# Patient Record
Sex: Male | Born: 1980 | Race: White | Hispanic: No | Marital: Married | Smoking: Former smoker
Health system: Southern US, Community
[De-identification: ages and names within clinical notes are randomized; demographics above are authoritative.]

## PROBLEM LIST (undated history)

## (undated) DIAGNOSIS — F419 Anxiety disorder, unspecified: Secondary | ICD-10-CM

## (undated) HISTORY — PX: ANTERIOR CRUCIATE LIGAMENT REPAIR: SHX115

---
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
## 2004-02-05 ENCOUNTER — Emergency Department (HOSPITAL_COMMUNITY): Admission: EM | Admit: 2004-02-05 | Discharge: 2004-02-05 | Payer: Self-pay | Admitting: Podiatry

## 2011-05-24 ENCOUNTER — Ambulatory Visit (INDEPENDENT_AMBULATORY_CARE_PROVIDER_SITE_OTHER): Payer: Self-pay

## 2011-05-24 DIAGNOSIS — H66009 Acute suppurative otitis media without spontaneous rupture of ear drum, unspecified ear: Secondary | ICD-10-CM

## 2011-05-24 DIAGNOSIS — J029 Acute pharyngitis, unspecified: Secondary | ICD-10-CM

## 2013-07-18 ENCOUNTER — Ambulatory Visit (INDEPENDENT_AMBULATORY_CARE_PROVIDER_SITE_OTHER): Payer: BC Managed Care – PPO | Admitting: Family Medicine

## 2013-07-18 VITALS — BP 122/80 | HR 62 | Temp 97.9°F | Resp 18 | Ht 71.5 in | Wt 231.5 lb

## 2013-07-18 DIAGNOSIS — G8929 Other chronic pain: Secondary | ICD-10-CM

## 2013-07-18 DIAGNOSIS — R1013 Epigastric pain: Secondary | ICD-10-CM

## 2013-07-18 LAB — POCT CBC
Granulocyte percent: 63.9 %G (ref 37–80)
HCT, POC: 50.4 % (ref 43.5–53.7)
Hemoglobin: 16.4 g/dL (ref 14.1–18.1)
Lymph, poc: 3.2 (ref 0.6–3.4)
MCH, POC: 31.1 pg (ref 27–31.2)
MCHC: 32.5 g/dL (ref 31.8–35.4)
MCV: 95.4 fL (ref 80–97)
MID (cbc): 1.4 — AB (ref 0–0.9)
MPV: 8.6 fL (ref 0–99.8)
POC Granulocyte: 8.2 — AB (ref 2–6.9)
POC LYMPH PERCENT: 24.9 %L (ref 10–50)
POC MID %: 11.2 %M (ref 0–12)
Platelet Count, POC: 307 10*3/uL (ref 142–424)
RBC: 5.28 M/uL (ref 4.69–6.13)
RDW, POC: 13.6 %
WBC: 12.8 10*3/uL — AB (ref 4.6–10.2)

## 2013-07-18 NOTE — Addendum Note (Signed)
Addended by: Gerrianne ScalePAYNE, Jasmin Trumbull L on: 07/18/2013 04:41 PM   Modules accepted: Orders

## 2013-07-18 NOTE — Progress Notes (Signed)
   Subjective:    Patient ID: Matthew Vazquez, male    DOB: 08/20/1980, 33 y.o.   MRN: [id number]  HPI Patient comes into our office with abdominal pain that's been going on for a little over a month He has been seeing black stool for about a month sometimes he may see red blood He's an social beer drinker drinks about a 12 pack in a week Been using Pepto bismol for a month to calm down his diarrhea No family HX of colon cancer or stomach cancer No HX of GI problems  Works in a factory  Review of Systems  Constitutional: Positive for appetite change and fatigue. Negative for fever.  Gastrointestinal: Positive for nausea, vomiting, abdominal pain and blood in stool.       Vomiting once or twice had some blood in it when it happen the first time  Genitourinary: Positive for dysuria.       A few times it burn when he urinate  Musculoskeletal: Positive for back pain.       Lower back pain   Frequent belching    Objective:   Physical Exam NAD No jaundice Chest:  Clear Heart:  Reg, no murmur Abdomen:  Active BS, nontender, no HSM or mass      Assessment & Plan:  Epigastric pains  .Abdominal pain, chronic, epigastric - Plan: POCT CBC, H. pylori Screen prilosec 20 mg OTC qd until labs back. Signed, Elvina SidleKurt Lisvet Rasheed, MD

## 2013-07-20 ENCOUNTER — Telehealth: Payer: Self-pay

## 2013-07-20 LAB — H. PYLORI ANTIBODY, IGG: H Pylori IgG: 0.53 {ISR}

## 2013-07-20 NOTE — Telephone Encounter (Addendum)
Wants a call back regarding the next step for abdominal pain.  Still in pain.  [phone]  Wife calling back for next step in treatment plan.  She is very concerned about her husbands condition.

## 2013-07-30 ENCOUNTER — Emergency Department (HOSPITAL_COMMUNITY): Payer: BC Managed Care – PPO

## 2013-07-30 ENCOUNTER — Encounter (HOSPITAL_COMMUNITY)
Admission: EM | Disposition: A | Discharge status: Home / Self Care | Payer: Self-pay | Source: Home / Self Care | Attending: Emergency Medicine

## 2013-07-30 ENCOUNTER — Encounter (HOSPITAL_COMMUNITY): Payer: BC Managed Care – PPO | Admitting: Anesthesiology

## 2013-07-30 ENCOUNTER — Observation Stay (HOSPITAL_COMMUNITY): Payer: BC Managed Care – PPO | Admitting: Anesthesiology

## 2013-07-30 ENCOUNTER — Observation Stay (HOSPITAL_COMMUNITY)
Admission: EM | Admit: 2013-07-30 | Discharge: 2013-07-31 | Disposition: A | Discharge status: Home / Self Care | Payer: BC Managed Care – PPO | Attending: Surgery | Admitting: Surgery

## 2013-07-30 ENCOUNTER — Encounter (HOSPITAL_COMMUNITY): Payer: Self-pay | Admitting: Emergency Medicine

## 2013-07-30 DIAGNOSIS — R300 Dysuria: Secondary | ICD-10-CM | POA: Insufficient documentation

## 2013-07-30 DIAGNOSIS — R110 Nausea: Secondary | ICD-10-CM | POA: Insufficient documentation

## 2013-07-30 DIAGNOSIS — K219 Gastro-esophageal reflux disease without esophagitis: Secondary | ICD-10-CM | POA: Insufficient documentation

## 2013-07-30 DIAGNOSIS — K5900 Constipation, unspecified: Secondary | ICD-10-CM | POA: Insufficient documentation

## 2013-07-30 DIAGNOSIS — K3580 Unspecified acute appendicitis: Principal | ICD-10-CM | POA: Diagnosis present

## 2013-07-30 DIAGNOSIS — Z79899 Other long term (current) drug therapy: Secondary | ICD-10-CM | POA: Insufficient documentation

## 2013-07-30 DIAGNOSIS — R079 Chest pain, unspecified: Secondary | ICD-10-CM | POA: Insufficient documentation

## 2013-07-30 DIAGNOSIS — Z87891 Personal history of nicotine dependence: Secondary | ICD-10-CM | POA: Insufficient documentation

## 2013-07-30 DIAGNOSIS — K37 Unspecified appendicitis: Secondary | ICD-10-CM | POA: Diagnosis present

## 2013-07-30 DIAGNOSIS — R1311 Dysphagia, oral phase: Secondary | ICD-10-CM | POA: Insufficient documentation

## 2013-07-30 HISTORY — PX: LAPAROSCOPIC APPENDECTOMY: SHX408

## 2013-07-30 LAB — COMPREHENSIVE METABOLIC PANEL
ALBUMIN: 4.2 g/dL (ref 3.5–5.2)
ALK PHOS: 42 U/L (ref 39–117)
ALT: 18 U/L (ref 0–53)
AST: 24 U/L (ref 0–37)
BUN: 12 mg/dL (ref 6–23)
CHLORIDE: 100 meq/L (ref 96–112)
CO2: 29 meq/L (ref 19–32)
Calcium: 9.5 mg/dL (ref 8.4–10.5)
Creatinine, Ser: 0.93 mg/dL (ref 0.50–1.35)
GFR calc Af Amer: >90 mL/min (ref >90)
GFR calc non Af Amer: >90 mL/min (ref >90)
Glucose, Bld: 95 mg/dL (ref 70–99)
Potassium: 4.4 mEq/L (ref 3.7–5.3)
Sodium: 139 mEq/L (ref 137–147)
Total Bilirubin: 1 mg/dL (ref 0.3–1.2)
Total Protein: 7.3 g/dL (ref 6.0–8.3)

## 2013-07-30 LAB — CBC WITH DIFFERENTIAL/PLATELET
BASOS ABS: 0.1 10*3/uL (ref 0.0–0.1)
BASOS PCT: 1 % (ref 0–1)
Eosinophils Absolute: 1.5 10*3/uL — ABNORMAL HIGH (ref 0.0–0.7)
Eosinophils Relative: 12 % — ABNORMAL HIGH (ref 0–5)
HCT: 46.2 % (ref 39.0–52.0)
Hemoglobin: 16.3 g/dL (ref 13.0–17.0)
LYMPHS PCT: 20 % (ref 12–46)
Lymphs Abs: 2.4 10*3/uL (ref 0.7–4.0)
MCH: 31.1 pg (ref 26.0–34.0)
MCHC: 35.3 g/dL (ref 30.0–36.0)
MCV: 88.2 fL (ref 78.0–100.0)
Monocytes Absolute: 0.9 10*3/uL (ref 0.1–1.0)
Monocytes Relative: 8 % (ref 3–12)
NEUTROS PCT: 59 % (ref 43–77)
Neutro Abs: 7.1 10*3/uL (ref 1.7–7.7)
Platelets: 251 10*3/uL (ref 150–400)
RBC: 5.24 MIL/uL (ref 4.22–5.81)
RDW: 12.9 % (ref 11.5–15.5)
WBC: 12 10*3/uL — AB (ref 4.0–10.5)

## 2013-07-30 LAB — URINALYSIS, ROUTINE W REFLEX MICROSCOPIC
Bilirubin Urine: NEGATIVE
GLUCOSE, UA: NEGATIVE mg/dL
HGB URINE DIPSTICK: NEGATIVE
KETONES UR: NEGATIVE mg/dL
Leukocytes, UA: NEGATIVE
Nitrite: NEGATIVE
PH: 7 (ref 5.0–8.0)
Protein, ur: NEGATIVE mg/dL
Specific Gravity, Urine: 1.02 (ref 1.005–1.030)
Urobilinogen, UA: 1 mg/dL (ref 0.0–1.0)

## 2013-07-30 LAB — LIPASE, BLOOD: Lipase: 35 U/L (ref 11–59)

## 2013-07-30 SURGERY — APPENDECTOMY, LAPAROSCOPIC
Anesthesia: General

## 2013-07-30 MED ORDER — FENTANYL CITRATE 0.05 MG/ML IJ SOLN
INTRAMUSCULAR | Status: AC
  Filled 2013-07-30: qty 5

## 2013-07-30 MED ORDER — ONDANSETRON HCL 4 MG/2ML IJ SOLN
INTRAMUSCULAR | Status: DC | PRN
  Administered 2013-07-30: 4 mg via INTRAVENOUS

## 2013-07-30 MED ORDER — ONDANSETRON HCL 4 MG/2ML IJ SOLN: 4.0000 mg | Freq: Four times a day (QID) | INTRAMUSCULAR | Status: DC | PRN

## 2013-07-30 MED ORDER — SODIUM CHLORIDE 0.9 % IV SOLN
INTRAVENOUS | Status: DC
  Administered 2013-07-30: 17:00:00 via INTRAVENOUS

## 2013-07-30 MED ORDER — LIDOCAINE HCL (CARDIAC) 20 MG/ML IV SOLN
INTRAVENOUS | Status: AC
  Filled 2013-07-30: qty 5

## 2013-07-30 MED ORDER — ROCURONIUM BROMIDE 100 MG/10ML IV SOLN
INTRAVENOUS | Status: DC | PRN
  Administered 2013-07-30: 25 mg via INTRAVENOUS

## 2013-07-30 MED ORDER — PROMETHAZINE HCL 25 MG/ML IJ SOLN
6.2500 mg | INTRAMUSCULAR | Status: DC | PRN
  Administered 2013-07-30: 6.25 mg via INTRAVENOUS

## 2013-07-30 MED ORDER — BUPIVACAINE-EPINEPHRINE 0.25% -1:200000 IJ SOLN
INTRAMUSCULAR | Status: DC | PRN
  Administered 2013-07-30: 10 mL

## 2013-07-30 MED ORDER — HYDROMORPHONE HCL PF 1 MG/ML IJ SOLN
0.5000 mg | INTRAMUSCULAR | Status: AC
  Administered 2013-07-30: 0.5 mg via INTRAVENOUS
  Filled 2013-07-30: qty 1

## 2013-07-30 MED ORDER — FENTANYL CITRATE 0.05 MG/ML IJ SOLN
INTRAMUSCULAR | Status: DC | PRN
  Administered 2013-07-30 (×3): 50 ug via INTRAVENOUS

## 2013-07-30 MED ORDER — BUPIVACAINE-EPINEPHRINE PF 0.25-1:200000 % IJ SOLN
INTRAMUSCULAR | Status: AC
  Filled 2013-07-30: qty 30

## 2013-07-30 MED ORDER — MIDAZOLAM HCL 5 MG/5ML IJ SOLN
INTRAMUSCULAR | Status: DC | PRN
  Administered 2013-07-30: 2 mg via INTRAVENOUS

## 2013-07-30 MED ORDER — IOHEXOL 300 MG/ML  SOLN
50.0000 mL | Freq: Once | INTRAMUSCULAR | Status: AC | PRN
  Administered 2013-07-30: 50 mL via ORAL

## 2013-07-30 MED ORDER — LACTATED RINGERS IV SOLN
INTRAVENOUS | Status: DC | PRN
  Administered 2013-07-30 (×2): via INTRAVENOUS

## 2013-07-30 MED ORDER — PROPOFOL 10 MG/ML IV BOLUS
INTRAVENOUS | Status: DC | PRN
  Administered 2013-07-30: 200 mg via INTRAVENOUS

## 2013-07-30 MED ORDER — GLYCOPYRROLATE 0.2 MG/ML IJ SOLN
INTRAMUSCULAR | Status: DC | PRN
  Administered 2013-07-30: 0.6 mg via INTRAVENOUS

## 2013-07-30 MED ORDER — LIDOCAINE HCL (CARDIAC) 20 MG/ML IV SOLN
INTRAVENOUS | Status: DC | PRN
  Administered 2013-07-30: 100 mg via INTRAVENOUS

## 2013-07-30 MED ORDER — SODIUM CHLORIDE 0.9 % IV SOLN: INTRAVENOUS | Status: DC

## 2013-07-30 MED ORDER — MIDAZOLAM HCL 2 MG/2ML IJ SOLN
INTRAMUSCULAR | Status: AC
  Filled 2013-07-30: qty 2

## 2013-07-30 MED ORDER — PROMETHAZINE HCL 25 MG/ML IJ SOLN
INTRAMUSCULAR | Status: AC
  Filled 2013-07-30: qty 1

## 2013-07-30 MED ORDER — ONDANSETRON HCL 4 MG/2ML IJ SOLN
4.0000 mg | Freq: Four times a day (QID) | INTRAMUSCULAR | Status: DC
  Administered 2013-07-30 – 2013-07-31 (×3): 4 mg via INTRAVENOUS
  Filled 2013-07-30 (×3): qty 2

## 2013-07-30 MED ORDER — DIPHENHYDRAMINE HCL 12.5 MG/5ML PO ELIX: 12.5000 mg | ORAL_SOLUTION | Freq: Four times a day (QID) | ORAL | Status: DC | PRN

## 2013-07-30 MED ORDER — PIPERACILLIN-TAZOBACTAM 3.375 G IVPB 30 MIN
3.3750 g | Freq: Once | INTRAVENOUS | Status: AC
  Administered 2013-07-30: 3.375 g via INTRAVENOUS
  Filled 2013-07-30: qty 50

## 2013-07-30 MED ORDER — MORPHINE SULFATE 2 MG/ML IJ SOLN: 2.0000 mg | INTRAMUSCULAR | Status: DC | PRN

## 2013-07-30 MED ORDER — IOHEXOL 300 MG/ML  SOLN
100.0000 mL | Freq: Once | INTRAMUSCULAR | Status: AC | PRN
  Administered 2013-07-30: 100 mL via INTRAVENOUS

## 2013-07-30 MED ORDER — OXYCODONE-ACETAMINOPHEN 5-325 MG PO TABS
1.0000 | ORAL_TABLET | ORAL | Status: DC | PRN
  Administered 2013-07-30 (×2): 1 via ORAL
  Filled 2013-07-30 (×2): qty 1

## 2013-07-30 MED ORDER — FENTANYL CITRATE 0.05 MG/ML IJ SOLN
25.0000 ug | INTRAMUSCULAR | Status: DC | PRN
  Administered 2013-07-30 (×2): 50 ug via INTRAVENOUS

## 2013-07-30 MED ORDER — ROCURONIUM BROMIDE 100 MG/10ML IV SOLN
INTRAVENOUS | Status: AC
  Filled 2013-07-30: qty 1

## 2013-07-30 MED ORDER — HYDROMORPHONE HCL PF 1 MG/ML IJ SOLN
1.0000 mg | INTRAMUSCULAR | Status: DC | PRN
  Administered 2013-07-30 – 2013-07-31 (×3): 1 mg via INTRAVENOUS
  Filled 2013-07-30 (×3): qty 1

## 2013-07-30 MED ORDER — LACTATED RINGERS IV SOLN: INTRAVENOUS | Status: DC

## 2013-07-30 MED ORDER — LACTATED RINGERS IR SOLN
Status: DC | PRN
  Administered 2013-07-30: 1000 mL

## 2013-07-30 MED ORDER — SODIUM CHLORIDE 0.9 % IJ SOLN
INTRAMUSCULAR | Status: AC
  Filled 2013-07-30: qty 10

## 2013-07-30 MED ORDER — FENTANYL CITRATE 0.05 MG/ML IJ SOLN
INTRAMUSCULAR | Status: AC
  Filled 2013-07-30: qty 2

## 2013-07-30 MED ORDER — DEXAMETHASONE SODIUM PHOSPHATE 10 MG/ML IJ SOLN
INTRAMUSCULAR | Status: AC
  Filled 2013-07-30: qty 1

## 2013-07-30 MED ORDER — GLYCOPYRROLATE 0.2 MG/ML IJ SOLN
INTRAMUSCULAR | Status: AC
  Filled 2013-07-30: qty 3

## 2013-07-30 MED ORDER — PROPOFOL 10 MG/ML IV BOLUS
INTRAVENOUS | Status: AC
  Filled 2013-07-30: qty 20

## 2013-07-30 MED ORDER — NEOSTIGMINE METHYLSULFATE 1 MG/ML IJ SOLN
INTRAMUSCULAR | Status: DC | PRN
  Administered 2013-07-30: 5 mg via INTRAVENOUS

## 2013-07-30 MED ORDER — SUCCINYLCHOLINE CHLORIDE 20 MG/ML IJ SOLN
INTRAMUSCULAR | Status: DC | PRN
  Administered 2013-07-30: 100 mg via INTRAVENOUS

## 2013-07-30 MED ORDER — ONDANSETRON HCL 4 MG/2ML IJ SOLN
INTRAMUSCULAR | Status: AC
  Filled 2013-07-30: qty 2

## 2013-07-30 MED ORDER — DEXAMETHASONE SODIUM PHOSPHATE 10 MG/ML IJ SOLN
INTRAMUSCULAR | Status: DC | PRN
  Administered 2013-07-30: 10 mg via INTRAVENOUS

## 2013-07-30 MED ORDER — DIPHENHYDRAMINE HCL 50 MG/ML IJ SOLN: 12.5000 mg | Freq: Four times a day (QID) | INTRAMUSCULAR | Status: DC | PRN

## 2013-07-30 MED ORDER — SODIUM CHLORIDE 0.9 % IV BOLUS (SEPSIS)
1000.0000 mL | INTRAVENOUS | Status: AC
  Administered 2013-07-30: 1000 mL via INTRAVENOUS

## 2013-07-30 MED ORDER — ONDANSETRON HCL 4 MG/2ML IJ SOLN
4.0000 mg | Freq: Once | INTRAMUSCULAR | Status: AC
  Administered 2013-07-30: 4 mg via INTRAVENOUS
  Filled 2013-07-30: qty 2

## 2013-07-30 SURGICAL SUPPLY — 35 items
APPLIER CLIP 5 13 M/L LIGAMAX5 (MISCELLANEOUS)
APPLIER CLIP ROT 10 11.4 M/L (STAPLE)
APR CLP MED LRG 11.4X10 (STAPLE)
APR CLP MED LRG 5 ANG JAW (MISCELLANEOUS)
BAG SPEC RTRVL LRG 6X4 10 (ENDOMECHANICALS)
CANISTER SUCTION 2500CC (MISCELLANEOUS) ×1 IMPLANT
CLIP APPLIE 5 13 M/L LIGAMAX5 (MISCELLANEOUS) IMPLANT
CLIP APPLIE ROT 10 11.4 M/L (STAPLE) IMPLANT
CLOSURE WOUND 1/2 X4 (GAUZE/BANDAGES/DRESSINGS) ×1
COVER TRANSDUCER ULTRASND (DRAPES) ×3 IMPLANT
DECANTER SPIKE VIAL GLASS SM (MISCELLANEOUS) ×1 IMPLANT
DEVICE PMI PUNCTURE CLOSURE (MISCELLANEOUS) ×2 IMPLANT
DRAPE LAPAROSCOPIC ABDOMINAL (DRAPES) ×3 IMPLANT
ELECT REM PT RETURN 9FT ADLT (ELECTROSURGICAL) ×3
ELECTRODE REM PT RTRN 9FT ADLT (ELECTROSURGICAL) ×1 IMPLANT
ENDOLOOP SUT PDS II  0 18 (SUTURE) ×4
ENDOLOOP SUT PDS II 0 18 (SUTURE) ×4 IMPLANT
GLOVE BIO SURGEON STRL SZ7.5 (GLOVE) ×11 IMPLANT
GOWN STRL REUS W/TWL XL LVL3 (GOWN DISPOSABLE) ×8 IMPLANT
KIT BASIN OR (CUSTOM PROCEDURE TRAY) ×3 IMPLANT
NDL INSUFFLATION 14GA 120MM (NEEDLE) ×1 IMPLANT
NEEDLE INSUFFLATION 14GA 120MM (NEEDLE) ×3 IMPLANT
POUCH SPECIMEN RETRIEVAL 10MM (ENDOMECHANICALS) IMPLANT
SET IRRIG TUBING LAPAROSCOPIC (IRRIGATION / IRRIGATOR) ×3 IMPLANT
SLEEVE XCEL OPT CAN 5 100 (ENDOMECHANICALS) ×3 IMPLANT
SOLUTION ANTI FOG 6CC (MISCELLANEOUS) ×3 IMPLANT
STRIP CLOSURE SKIN 1/2X4 (GAUZE/BANDAGES/DRESSINGS) ×2 IMPLANT
SUT MNCRL AB 4-0 PS2 18 (SUTURE) ×3 IMPLANT
TOWEL OR 17X26 10 PK STRL BLUE (TOWEL DISPOSABLE) ×3 IMPLANT
TOWEL OR NON WOVEN STRL DISP B (DISPOSABLE) ×3 IMPLANT
TRAY FOLEY CATH 14FRSI W/METER (CATHETERS) ×2 IMPLANT
TRAY LAP CHOLE (CUSTOM PROCEDURE TRAY) ×3 IMPLANT
TROCAR BLADELESS OPT 5 100 (ENDOMECHANICALS) ×3 IMPLANT
TROCAR XCEL NON-BLD 11X100MML (ENDOMECHANICALS) ×3 IMPLANT
TUBING INSUFFLATION 10FT LAP (TUBING) ×3 IMPLANT

## 2013-07-30 NOTE — ED Notes (Signed)
Patient transported to CT 

## 2013-07-30 NOTE — Discharge Instructions (Signed)
Laparoscopic Appendectomy °Care After °Refer to this sheet in the next few weeks. These instructions provide you with information on caring for yourself after your procedure. Your caregiver may also give you more specific instructions. Your treatment has been planned according to current medical practices, but problems sometimes occur. Call your caregiver if you have any problems or questions after your procedure. °HOME CARE INSTRUCTIONS °· Do not drive while taking narcotic pain medicines. °· Use stool softener if you become constipated from your pain medicines. °· Change your bandages (dressings) as directed. °· Keep your wounds clean and dry. You may wash the wounds gently with soap and water. Gently pat the wounds dry with a clean towel. °· Do not take baths, swim, or use hot tubs for 10 days, or as instructed by your caregiver. °· Only take over-the-counter or prescription medicines for pain, discomfort, or fever as directed by your caregiver. °· You may continue your normal diet as directed. °· Do not lift more than 10 pounds (4.5 kg) or play contact sports for 3 weeks, or as directed. °· Slowly increase your activity after surgery. °· Take deep breaths to avoid getting a lung infection (pneumonia). °SEEK MEDICAL CARE IF: °· You have redness, swelling, or increasing pain in your wounds. °· You have pus coming from your wounds. °· You have drainage from a wound that lasts longer than 1 day. °· You notice a bad smell coming from the wounds or dressing. °· Your wound edges break open after stitches (sutures) have been removed. °· You notice increasing pain in the shoulders (shoulder strap areas) or near your shoulder blades. °· You develop dizzy episodes or fainting while standing. °· You develop shortness of breath. °· You develop persistent nausea or vomiting. °· You cannot control your bowel functions or lose your appetite. °· You develop diarrhea. °SEEK IMMEDIATE MEDICAL CARE IF:  °· You have a fever. °· You  develop a rash. °· You have difficulty breathing or sharp pains in your chest. °· You develop any reaction or side effects to medicines given. °MAKE SURE YOU: °· Understand these instructions. °· Will watch your condition. °· Will get help right away if you are not doing well or get worse. °Document Released: 05/07/2005 Document Revised: 07/30/2011 Document Reviewed: 11/14/2010 °ExitCare® Patient Information ©2014 [employer]. ° °CCS ______[facility] °LAPAROSCOPIC SURGERY: POST OP INSTRUCTIONS °Always review your discharge instruction sheet given to you by the facility where your surgery was performed. °IF YOU HAVE DISABILITY OR FAMILY LEAVE FORMS, YOU MUST BRING THEM TO THE OFFICE FOR PROCESSING.   °DO NOT GIVE THEM TO YOUR DOCTOR. ° °1. A prescription for pain medication may be given to you upon discharge.  Take your pain medication as prescribed, if needed.  If narcotic pain medicine is not needed, then you may take acetaminophen (Tylenol) or ibuprofen (Advil) as needed. °2. Take your usually prescribed medications unless otherwise directed. °3. If you need a refill on your pain medication, please contact your pharmacy.  They will contact our office to request authorization. Prescriptions will not be filled after 5pm or on week-ends. °4. You should follow a light diet the first few days after arrival home, such as soup and crackers, etc.  Be sure to include lots of fluids daily. °5. Most patients will experience some swelling and bruising in the area of the incisions.  Ice packs will help.  Swelling and bruising can take several days to resolve.  °6. It is common to experience   some constipation if taking pain medication after surgery.  Increasing fluid intake and taking a stool softener (such as Colace) will usually help or prevent this problem from occurring.  A mild laxative (Milk of Magnesia or Miralax) should be taken according to package instructions if there are no bowel movements  after 48 hours. °7. Unless discharge instructions indicate otherwise, you may remove your bandages 24-48 hours after surgery, and you may shower at that time.  You may have steri-strips (small skin tapes) in place directly over the incision.  These strips should be left on the skin for 7-10 days.  If your surgeon used skin glue on the incision, you may shower in 24 hours.  The glue will flake off over the next 2-3 weeks.  Any sutures or staples will be removed at the office during your follow-up visit. °8. ACTIVITIES:  You may resume regular (light) daily activities beginning the next day--such as daily self-care, walking, climbing stairs--gradually increasing activities as tolerated.  You may have sexual intercourse when it is comfortable.  Refrain from any heavy lifting or straining until approved by your doctor. °a. You may drive when you are no longer taking prescription pain medication, you can comfortably wear a seatbelt, and you can safely maneuver your car and apply brakes. °b. RETURN TO WORK:  __________________________________________________________ °9. You should see your doctor in the office for a follow-up appointment approximately 2-3 weeks after your surgery.  Make sure that you call for this appointment within a day or two after you arrive home to insure a convenient appointment time. °10. OTHER INSTRUCTIONS: __________________________________________________________________________________________________________________________ __________________________________________________________________________________________________________________________ °WHEN TO CALL YOUR DOCTOR: °1. Fever over 101.0 °2. Inability to urinate °3. Continued bleeding from incision. °4. Increased pain, redness, or drainage from the incision. °5. Increasing abdominal pain ° °The clinic staff is available to answer your questions during regular business hours.  Please don’t hesitate to call and ask to speak to one of the  nurses for clinical concerns.  If you have a medical emergency, go to the nearest emergency room or call 911.  A surgeon from [facility] is always on call at the hospital. °[address], [address], [place], [place]  [address] ? [address] [address], [place], [place]   [address] °[phone] ? [phone] ? FAX [phone] °Web site: [email or web address] ° °

## 2013-07-30 NOTE — Anesthesia Preprocedure Evaluation (Signed)
Anesthesia Evaluation  Patient identified by MRN, date of birth, ID band Patient awake    Reviewed: Allergy & Precautions, H&P , NPO status , Patient's Chart, lab work & pertinent test results  Airway Mallampati: II TM Distance: >3 FB Neck ROM: full    Dental no notable dental hx. (+) Teeth Intact, Dental Advisory Given   Pulmonary neg pulmonary ROS, former smoker,  breath sounds clear to auscultation  Pulmonary exam normal       Cardiovascular Exercise Tolerance: Good negative cardio ROS  Rhythm:regular Rate:Normal     Neuro/Psych negative neurological ROS  negative psych ROS   GI/Hepatic negative GI ROS, Neg liver ROS,   Endo/Other  negative endocrine ROS  Renal/GU negative Renal ROS  negative genitourinary   Musculoskeletal   Abdominal   Peds  Hematology negative hematology ROS (+)   Anesthesia Other Findings   Reproductive/Obstetrics negative OB ROS                           Anesthesia Physical Anesthesia Plan  ASA: I and emergent  Anesthesia Plan: General   Post-op Pain Management:    Induction: Intravenous, Rapid sequence and Cricoid pressure planned  Airway Management Planned: Oral ETT  Additional Equipment:   Intra-op Plan:   Post-operative Plan: Extubation in OR  Informed Consent: I have reviewed the patients History and Physical, chart, labs and discussed the procedure including the risks, benefits and alternatives for the proposed anesthesia with the patient or authorized representative who has indicated his/her understanding and acceptance.   Dental Advisory Given  Plan Discussed with: CRNA and Surgeon  Anesthesia Plan Comments:         Anesthesia Quick Evaluation

## 2013-07-30 NOTE — ED Notes (Signed)
Patient is aware about urine sample. Patient will try to give sample in a little while and will notify RN or Tech. Urinal at beside.

## 2013-07-30 NOTE — H&P (Signed)
Matthew Vazquez is an 33 y.o. male.   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  He has had some abdominal discomfort on and off for 3 months, he cut out spicy foods, took allot of Pepto bismol, and eventually saw [facility].  He improved some on PPI, and now H@.  H Pylori was negative.  He says yesterday he started having lower abd pain mainly RLQ.  He also had some loose stools, nausea, but no vomiting.  He ate a couple bites of chicken last PM, and went back to bed.  He continues to have significant pain RLQ, which he just normally ignores. He came to the ER because pain was so bad this AM. CT scan shows the appendix was dilated and thickened with mesenteric inflammation.  WBC is 12.0.  Other labs were normal.  History reviewed. No pertinent past medical history. None History reviewed. No pertinent past surgical history. None History reviewed. No pertinent family history. Social History:  reports that he has quit smoking. He does not have any smokeless tobacco history on file. He reports that he drinks alcohol. His drug history is not on file. 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   Allergies: No Known Allergies  Prior to Admission medications   Medication Sig Start Date End Date Taking? Authorizing Provider  Cholecalciferol (VITAMIN D3) 2000 UNITS TABS Take 2,000 Units by mouth daily.   Yes Historical Provider, MD  Nyoka Cowden Tea, Camillia sinensis, (GREEN TEA PO) Take 1 tablet by mouth daily.   Yes Historical Provider, MD  L-Arginine 500 MG TABS Take 500 mg by mouth daily.   Yes Historical Provider, MD  L-Glutamine 500 MG TABS Take 500 mg by mouth daily.   Yes Historical Provider, MD  LevOCARNitine (L-CARNITINE) 500 MG TABS Take 500 mg by mouth daily.   Yes Historical Provider, MD   Multiple Vitamins-Minerals (MULTIVITAMIN WITH MINERALS) tablet Take 1 tablet by mouth daily.   Yes Historical Provider, MD  Omega-3 Fatty Acids (OMEGA-3 FISH OIL PO) Take 1 capsule by mouth daily.   Yes Historical Provider, MD  Pseudoeph-Doxylamine-DM-APAP (NYQUIL PO) Take 2 capsules by mouth at bedtime as needed (cold).   Yes Historical Provider, MD  ranitidine (ZANTAC) 150 MG tablet Take 150 mg by mouth 2 (two) times daily as needed for heartburn.   Yes Historical Provider, MD     Results for orders placed during the hospital encounter of 07/30/13 (from the past 48 hour(s))  CBC WITH DIFFERENTIAL     Status: Abnormal   Collection Time    07/30/13  8:50 AM      Result Value Ref Range   WBC 12.0 (*) 4.0 - 10.5 K/uL   RBC 5.24  4.22 - 5.81 MIL/uL   Hemoglobin 16.3  13.0 - 17.0 g/dL   HCT 46.2  39.0 - 52.0 %   MCV 88.2  78.0 - 100.0 fL   MCH 31.1  26.0 - 34.0 pg   MCHC 35.3  30.0 - 36.0 g/dL   RDW 12.9  11.5 - 15.5 %   Platelets 251  150 - 400 K/uL   Neutrophils Relative % 59  43 - 77 %   Neutro Abs 7.1  1.7 - 7.7 K/uL   Lymphocytes Relative 20  12 - 46 %  Lymphs Abs 2.4  0.7 - 4.0 K/uL   Monocytes Relative 8  3 - 12 %   Monocytes Absolute 0.9  0.1 - 1.0 K/uL   Eosinophils Relative 12 (*) 0 - 5 %   Eosinophils Absolute 1.5 (*) 0.0 - 0.7 K/uL   Basophils Relative 1  0 - 1 %   Basophils Absolute 0.1  0.0 - 0.1 K/uL  COMPREHENSIVE METABOLIC PANEL     Status: None   Collection Time    07/30/13  8:50 AM      Result Value Ref Range   Sodium 139  137 - 147 mEq/L   Potassium 4.4  3.7 - 5.3 mEq/L   Chloride 100  96 - 112 mEq/L   CO2 29  19 - 32 mEq/L   Glucose, Bld 95  70 - 99 mg/dL   BUN 12  6 - 23 mg/dL   Creatinine, Ser 0.93  0.50 - 1.35 mg/dL   Calcium 9.5  8.4 - 10.5 mg/dL   Total Protein 7.3  6.0 - 8.3 g/dL   Albumin 4.2  3.5 - 5.2 g/dL   AST 24  0 - 37 U/L   ALT 18  0 - 53 U/L   Alkaline Phosphatase 42  39 - 117 U/L   Total Bilirubin 1.0  0.3 - 1.2 mg/dL   GFR calc non  Af Amer >90  >90 mL/min   GFR calc Af Amer >90  >90 mL/min   Comment: (NOTE)     The eGFR has been calculated using the CKD EPI equation.     This calculation has not been validated in all clinical situations.     eGFR's persistently <90 mL/min signify possible Chronic Kidney     Disease.  LIPASE, BLOOD     Status: None   Collection Time    07/30/13  8:50 AM      Result Value Ref Range   Lipase 35  11 - 59 U/L  URINALYSIS, ROUTINE W REFLEX MICROSCOPIC     Status: Abnormal   Collection Time    07/30/13 10:21 AM      Result Value Ref Range   Color, Urine YELLOW  YELLOW   APPearance CLOUDY (*) CLEAR   Specific Gravity, Urine 1.020  1.005 - 1.030   pH 7.0  5.0 - 8.0   Glucose, UA NEGATIVE  NEGATIVE mg/dL   Hgb urine dipstick NEGATIVE  NEGATIVE   Bilirubin Urine NEGATIVE  NEGATIVE   Ketones, ur NEGATIVE  NEGATIVE mg/dL   Protein, ur NEGATIVE  NEGATIVE mg/dL   Urobilinogen, UA 1.0  0.0 - 1.0 mg/dL   Nitrite NEGATIVE  NEGATIVE   Leukocytes, UA NEGATIVE  NEGATIVE   Comment: MICROSCOPIC NOT DONE ON URINES WITH NEGATIVE PROTEIN, BLOOD, LEUKOCYTES, NITRITE, OR GLUCOSE <1000 mg/dL.   Ct Abdomen Pelvis W Contrast  07/30/2013   CLINICAL DATA:  Abdominal pain  EXAM: CT ABDOMEN AND PELVIS WITH CONTRAST  TECHNIQUE: Multidetector CT imaging of the abdomen and pelvis was performed using the standard protocol following bolus administration of intravenous contrast. Oral contrast was also administered.  CONTRAST:  164mL OMNIPAQUE IOHEXOL 300 MG/ML  SOLN  COMPARISON:  None.  FINDINGS: Lung bases are clear.  No focal liver lesions are identified. There is no biliary duct dilatation. Gallbladder wall is not thickened.  Spleen, pancreas, adrenals appear normal.  Kidneys bilaterally show no mass, calculus, or hydronephrosis on either side. There is no ureteral calculus or ureterectasis on either side.  In the  pelvis, the urinary bladder wall is normal in thickness. There E bladder is midline. There is no pelvic  mass or fluid collection.  The appendix is dilated with thickened wall and surrounding mesenteric inflammation consistent with acute appendiceal inflammation.  There is no bowel obstruction.  No free air or portal venous air.  There is no ascites, adenopathy, or abscess in the abdomen or pelvis. Aorta shows no evidence of aneurysm. There are no blastic or lytic bone lesions.  IMPRESSION: Evidence of acute appendiceal inflammation. No frank abscess. No bowel obstruction. Study otherwise unremarkable.  Critical Value/emergent results were called by telephone at the time of interpretation on 07/30/2013 at 10:11 AM to Dr. Shea Evans, Aline Brochure , who verbally acknowledged these results.   Electronically Signed   By: Lowella Grip M.D.   On: 07/30/2013 10:12    Review of Systems  Constitutional: Negative.   HENT: Negative.   Eyes: Negative.   Respiratory: Negative for cough, hemoptysis, sputum production, shortness of breath and wheezing.        His wife says he snores and sometimes holds his breath  Cardiovascular: Positive for chest pain (some associated with abd discomfort.  nothing exertional). Negative for palpitations, orthopnea, claudication, leg swelling and PND.  Gastrointestinal: Positive for heartburn, nausea and abdominal pain (mostly lower adomen and RLQ since yesterday afternoon.). Negative for vomiting.  Genitourinary: Negative.   Musculoskeletal: Negative.   Skin: Negative.   Neurological: Negative.   Endo/Heme/Allergies: Negative.   Psychiatric/Behavioral: Negative.     Blood pressure 125/76, pulse 68, temperature 98.4 F (36.9 C), temperature source Oral, resp. rate 16, SpO2 100.00%. Physical Exam  Constitutional: He is oriented to person, place, and time. He appears well-developed and well-nourished. No distress.  HENT:  Head: Normocephalic and atraumatic.  Nose: Nose normal.  Eyes: Conjunctivae and EOM are normal. Pupils are equal, round, and reactive to light. Right eye  exhibits no discharge. Left eye exhibits no discharge. No scleral icterus.  Neck: Normal range of motion. Neck supple. No JVD present. No tracheal deviation present. No thyromegaly present.  Cardiovascular: Normal rate, regular rhythm, normal heart sounds and intact distal pulses.  Exam reveals no gallop.   Respiratory: Effort normal and breath sounds normal. No respiratory distress. He has no wheezes. He has no rales. He exhibits no tenderness.  GI: Soft. Bowel sounds are normal. He exhibits no distension and no mass. There is tenderness (RLQ, but some soreness lower abdomin, mid portion.). There is no rebound and no guarding.  Musculoskeletal: He exhibits no edema and no tenderness.  Lymphadenopathy:    He has no cervical adenopathy.  Neurological: He is alert and oriented to person, place, and time. No cranial nerve deficit.  Skin: No rash noted. He is not diaphoretic. No erythema. No pallor.  Psychiatric: He has a normal mood and affect. His behavior is normal. Judgment and thought content normal.     Assessment/Plan Acute appendicitis GERD Possible sleep apnea  Plan:  Going to OR ASAP, Iv antibiotics, hydrate  Airen Stiehl 07/30/2013, 11:20 AM

## 2013-07-30 NOTE — Discharge Summary (Signed)
Physician Discharge Summary  Patient ID: Matthew Vazquez MRN: [id number] DOB/AGE: [birth year] 32 y.o.  Admit date: 07/30/2013 Discharge date: 07/31/2013  Admission Diagnoses:  Acute appendicitis  GERD  Possible sleep apnea  Discharge Diagnoses:  Acute appendicitis  GERD  Possible sleep apnea  Active Problems:   Appendicitis   Acute appendicitis   PROCEDURES: laparoscopic appendectomy 07/30/13, Dr. 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 He has had some abdominal discomfort on and off for 3 months, he cut out spicy foods, took allot of Pepto bismol, and eventually saw Dr. [facility] on [facility]. He improved some on PPI, and now H@. H Pylori was negative. He says yesterday he started having lower abd pain mainly RLQ. He also had some loose stools, nausea, but no vomiting. He ate a couple bites of chicken last PM, and went back to bed. He continues to have significant pain RLQ, which he just normally ignores. He came to the ER because pain was so bad this AM.  CT scan shows the appendix was dilated and thickened with mesenteric inflammation. WBC is 12.0. Other labs were normal.  Pt seen in the ER and taken to the OR for acute appendicitis.  He did well with surgery and advanced his diet without difficulty.  He did have some pain post op with voiding.  He had a difficult foley placement.  This has improved, but not resolved at d/c.  His repeat UA is clear and he is voiding without difficulty. Follow up in 2 weeks.  Condition on d/c:  Improved   Disposition: Home     Medication List         GREEN TEA PO  Take 1 tablet by mouth daily.     ibuprofen 200 MG tablet  Commonly known as:  ADVIL  You can take 2-3 tablets every 6 hours as needed for pain.     L-Arginine 500 MG Tabs  Take 500 mg by mouth daily.     L-Carnitine 500 MG Tabs  Take 500 mg by mouth daily.     L-Glutamine 500 MG Tabs   Take 500 mg by mouth daily.     multivitamin with minerals tablet  Take 1 tablet by mouth daily.     NYQUIL PO  Take 2 capsules by mouth at bedtime as needed (cold).     OMEGA-3 FISH OIL PO  Take 1 capsule by mouth daily.     oxyCODONE-acetaminophen 5-325 MG per tablet  Commonly known as:  PERCOCET/ROXICET  Take 1-2 tablets by mouth every 4 (four) hours as needed for moderate pain.     phenazopyridine 200 MG tablet  Commonly known as:  PYRIDIUM  You can take 1 tablet three times a day as needed to help with voiding.  This will turn your urine an orange color, so don't be alarmed by this.     ranitidine 150 MG tablet  Commonly known as:  ZANTAC  Take 150 mg by mouth 2 (two) times daily as needed for heartburn.     Vitamin D3 2000 UNITS Tabs  Take 2,000 Units by mouth daily.       Follow-up Information   Follow up with Lajean Saveramirez Jr., Armando, MD. Schedule an appointment as soon as possible for a visit in 2 weeks. (For wound re-check)    Specialty:  General Surgery   Contact information:   [address] [place] [address] [phone]  SignedSherrie George 07/31/2013, 11:38 AM

## 2013-07-30 NOTE — Op Note (Signed)
07/30/2013  12:55 PM  PATIENT:  Matthew Vazquez  33 y.o. male  PRE-OPERATIVE DIAGNOSIS:  appendicitis  POST-OPERATIVE DIAGNOSIS:  acute appendicitis non perforated  PROCEDURE:  Procedure(s): APPENDECTOMY LAPAROSCOPIC (N/A)  SURGEON:  Surgeon(s) and Role:    * Axel FillerArmando Kanae Ignatowski, MD - Primary  PHYSICIAN ASSISTANT:   ASSISTANTS: none   ANESTHESIA:   local and general  EBL:  Total I/O In: 1000 [I.V.:1000] Out: -   BLOOD ADMINISTERED:none  DRAINS: none   LOCAL MEDICATIONS USED:  BUPIVICAINE   SPECIMEN:  Source of Specimen:  appendix  DISPOSITION OF SPECIMEN:  PATHOLOGY  COUNTS:  YES  TOURNIQUET:  * No tourniquets in log *  DICTATION: .Dragon Dictation  Details of the procedure:The patient was taken back to the operating room. The patient was placed in supine position with bilateral SCDs in place. After appropriate anitbiotics were confirmed, a time-out was confirmed and all facts were verified.  A pneumoperitoneum of 14 mmHg was obtained via a Veress needle technique in the left lower quadrant quadrant.  A 5 mm trocar and 5 mm camera then placed intra-abdominally there is no injury to any intra-abdominal organs a 10 mm infraumbilical port was placed and direct visualization as was a 5 mm port in the suprapubic area. The appendix was identified  The appendix identified and cleaned down to the appendiceal base. The appendiceal artery was taken with Bovie cautery maintaining hemostasis, the mesoappendix was then incised.  The the appendiceal base was clean.  At this time an Endoloop was placed proximallyx2 and one distally and the appendix was transected between these 2. Then a latex retrieval bag was then placed into the abdomen and the specimen placed in the bag. The appendiceal stump was cauterized. We evacuate the fluid from the pelvis until the effluent was clear. The omentum was brought over the appendiceal stump. The appendix and latex retrieval  bag was then retrieved via  the supraumbilical port. #1 Vicryl was used to reapproximate the fascia at the umbilical port site x2. The skin was reapproximated all port sites 3-0 Monocryl subcuticular fashion. The skin was dressed with Steri-Strips gauze and tape. The patient was awakened from general anesthesia was taken to recovery room in stable condition.       PLAN OF CARE: Admit for overnight observation  PATIENT DISPOSITION:  PACU - hemodynamically stable.   Delay start of Pharmacological VTE agent (>24hrs) due to surgical blood loss or risk of bleeding: not applicable

## 2013-07-30 NOTE — ED Notes (Signed)
Pt states abdominal pain x 3 months.  Vomited once over a month ago but has been nauseated.  Pt c/o diarrhea off and on also.  Concerned for gallbladder d/t multiple people in his family have gallbladder disease.  Pt states pain is progressively getting worse.

## 2013-07-30 NOTE — H&P (Signed)
I have seen and examined the pt and agree with PA-Jenning's H&P note. Acute appendicitis All risks and benefits were discussed with the patient, to generally include infection, bleeding, and damage to surrounding structures. Alternatives were offered and described.  All questions were answered and the patient voiced understanding of the procedure and wishes to proceed at this point.

## 2013-07-30 NOTE — ED Provider Notes (Signed)
CSN: [id number]     Arrival date & time 07/30/13  0865 History   First MD Initiated Contact with Patient 07/30/13 [phone]     Chief Complaint  Patient presents with  . Abdominal Pain     (Consider location/radiation/quality/duration/timing/severity/associated sxs/prior Treatment) Patient is a 33 y.o. male presenting with abdominal pain. The history is provided by the patient.  Abdominal Pain Pain location:  Periumbilical and RLQ Pain quality: aching and cramping   Pain radiates to:  Does not radiate Pain severity:  Moderate Onset quality:  Gradual Duration: 3 months. Timing:  Intermittent Progression:  Worsening Chronicity:  Chronic Context comment:  At rest Relieved by: mildly by ppi/H2 blockers. Worsened by:  Nothing tried Ineffective treatments:  None tried Associated symptoms: chest pain (occasional chest pains), constipation, diarrhea and nausea   Associated symptoms: no cough, no dysuria, no fever, no hematuria, no shortness of breath and no vomiting     History reviewed. No pertinent past medical history. History reviewed. No pertinent past surgical history. History reviewed. No pertinent family history. History  Substance Use Topics  . Smoking status: Former Games developer  . Smokeless tobacco: Not on file  . Alcohol Use: Yes     Comment: 12 a week    Review of Systems  Constitutional: Negative for fever.  HENT: Negative for drooling and rhinorrhea.   Eyes: Negative for pain.  Respiratory: Negative for cough and shortness of breath.   Cardiovascular: Positive for chest pain (occasional chest pains). Negative for leg swelling.  Gastrointestinal: Positive for nausea, abdominal pain, diarrhea and constipation. Negative for vomiting.  Genitourinary: Negative for dysuria and hematuria.  Musculoskeletal: Negative for gait problem and neck pain.  Skin: Negative for color change.  Neurological: Negative for numbness and headaches.  Hematological: Negative for adenopathy.   Psychiatric/Behavioral: Negative for behavioral problems.  All other systems reviewed and are negative.      Allergies  Review of patient's allergies indicates no known allergies.  Home Medications   Current Outpatient Rx  Name  Route  Sig  Dispense  Refill  . bismuth subsalicylate (PEPTO BISMOL) 262 MG/15ML suspension   Oral   Take 30 mLs by mouth every 6 (six) hours as needed.          BP 125/76  Pulse 68  Temp(Src) 98.4 F (36.9 C) (Oral)  Resp 16  SpO2 100% Physical Exam  Nursing note and vitals reviewed. Constitutional: He is oriented to person, place, and time. He appears well-developed and well-nourished.  HENT:  Head: Normocephalic and atraumatic.  Right Ear: External ear normal.  Left Ear: External ear normal.  Nose: Nose normal.  Mouth/Throat: Oropharynx is clear and moist. No oropharyngeal exudate.  Eyes: Conjunctivae and EOM are normal. Pupils are equal, round, and reactive to light.  Neck: Normal range of motion. Neck supple.  Cardiovascular: Normal rate, regular rhythm, normal heart sounds and intact distal pulses.  Exam reveals no gallop and no friction rub.   No murmur heard. Pulmonary/Chest: Effort normal and breath sounds normal. No respiratory distress. He has no wheezes.  Abdominal: Soft. Bowel sounds are normal. He exhibits no distension. There is tenderness (mild tenderness to palpation of the right lower quadrant.). There is no rebound and no guarding.  Musculoskeletal: Normal range of motion. He exhibits no edema and no tenderness.  Neurological: He is alert and oriented to person, place, and time.  Skin: Skin is warm and dry.  Psychiatric: He has a normal mood and affect. His behavior is normal.  ED Course  Procedures (including critical care time) Labs Review Labs Reviewed  CBC WITH DIFFERENTIAL - Abnormal; Notable for the following:    WBC 12.0 (*)    Eosinophils Relative 12 (*)    Eosinophils Absolute 1.5 (*)    All other  components within normal limits  URINALYSIS, ROUTINE W REFLEX MICROSCOPIC - Abnormal; Notable for the following:    APPearance CLOUDY (*)    All other components within normal limits  COMPREHENSIVE METABOLIC PANEL  LIPASE, BLOOD   Imaging Review Ct Abdomen Pelvis W Contrast  07/30/2013   CLINICAL DATA:  Abdominal pain  EXAM: CT ABDOMEN AND PELVIS WITH CONTRAST  TECHNIQUE: Multidetector CT imaging of the abdomen and pelvis was performed using the standard protocol following bolus administration of intravenous contrast. Oral contrast was also administered.  CONTRAST:  100mL OMNIPAQUE IOHEXOL 300 MG/ML  SOLN  COMPARISON:  None.  FINDINGS: Lung bases are clear.  No focal liver lesions are identified. There is no biliary duct dilatation. Gallbladder wall is not thickened.  Spleen, pancreas, adrenals appear normal.  Kidneys bilaterally show no mass, calculus, or hydronephrosis on either side. There is no ureteral calculus or ureterectasis on either side.  In the pelvis, the urinary bladder wall is normal in thickness. There E bladder is midline. There is no pelvic mass or fluid collection.  The appendix is dilated with thickened wall and surrounding mesenteric inflammation consistent with acute appendiceal inflammation.  There is no bowel obstruction.  No free air or portal venous air.  There is no ascites, adenopathy, or abscess in the abdomen or pelvis. Aorta shows no evidence of aneurysm. There are no blastic or lytic bone lesions.  IMPRESSION: Evidence of acute appendiceal inflammation. No frank abscess. No bowel obstruction. Study otherwise unremarkable.  Critical Value/emergent results were called by telephone at the time of interpretation on 07/30/2013 at 10:11 AM to Dr. Randa SpikeFORREST, Romeo AppleHARRISON , who verbally acknowledged these results.   Electronically Signed   By: Bretta BangWilliam  Woodruff M.D.   On: 07/30/2013 10:12     EKG Interpretation   Date/Time:  Thursday July 30 2013 09:03:57 EDT Ventricular Rate:   67 PR Interval:  166 QRS Duration: 89 QT Interval:  383 QTC Calculation: 404 R Axis:   59 Text Interpretation:  Sinus rhythm ST elev, probable normal early repol  pattern Confirmed by Lourene Hoston  MD, Mallery Harshman ([id number]) on 07/30/2013 9:06:45 AM      MDM   Final diagnoses:  Appendicitis    8:48 AM 33 y.o. male who presents with intermittent abdominal pain for the last 3 months. He notes acute worsening yesterday afternoon while at work. He states that he was unable to get comfortable last night. He denies any fevers or vomiting. He has had nausea and diarrhea. Of note he states that he drank 2 beers 2 nights ago. He is been on Zantac, Prevacid, and tums with mild relief. He is afebrile and vital signs are unremarkable here. He states that his pain is worse in the right lower quadrant. Will get labs, pain control, CT scan.  10:35 AM Discussed case w/ [facility]. Will order IV zosyn. Plan to go to OR.   Junius ArgyleForrest S Vuk Skillern, MD 07/30/13 1504

## 2013-07-30 NOTE — Transfer of Care (Signed)
Immediate Anesthesia Transfer of Care Note  Patient: Matthew NearingGabriel J Funes  Procedure(s) Performed: Procedure(s): APPENDECTOMY LAPAROSCOPIC (N/A)  Patient Location: PACU  Anesthesia Type:General  Level of Consciousness: sedated  Airway & Oxygen Therapy: Patient Spontanous Breathing and Patient connected to face mask oxygen  Post-op Assessment: Report given to PACU RN and Post -op Vital signs reviewed and stable  Post vital signs: Reviewed and stable  Complications: No apparent anesthesia complications

## 2013-07-30 NOTE — Anesthesia Postprocedure Evaluation (Signed)
  Anesthesia Post-op Note  Patient: Matthew NearingGabriel J Hanrahan  Procedure(s) Performed: Procedure(s) (LRB): APPENDECTOMY LAPAROSCOPIC (N/A)  Patient Location: PACU  Anesthesia Type: General  Level of Consciousness: awake and alert   Airway and Oxygen Therapy: Patient Spontanous Breathing  Post-op Pain: mild  Post-op Assessment: Post-op Vital signs reviewed, Patient's Cardiovascular Status Stable, Respiratory Function Stable, Patent Airway and No signs of Nausea or vomiting  Last Vitals:  Filed Vitals:   07/30/13 1330  BP: 145/96  Pulse: 72  Temp:   Resp: 23    Post-op Vital Signs: stable   Complications: No apparent anesthesia complications

## 2013-07-31 ENCOUNTER — Encounter: Payer: Self-pay | Admitting: General Surgery

## 2013-07-31 ENCOUNTER — Encounter (HOSPITAL_COMMUNITY): Payer: Self-pay | Admitting: General Surgery

## 2013-07-31 LAB — URINALYSIS, ROUTINE W REFLEX MICROSCOPIC
Bilirubin Urine: NEGATIVE
Glucose, UA: NEGATIVE mg/dL
Hgb urine dipstick: NEGATIVE
Ketones, ur: NEGATIVE mg/dL
Leukocytes, UA: NEGATIVE
NITRITE: NEGATIVE
PH: 6 (ref 5.0–8.0)
Protein, ur: NEGATIVE mg/dL
SPECIFIC GRAVITY, URINE: 1.009 (ref 1.005–1.030)
Urobilinogen, UA: 1 mg/dL (ref 0.0–1.0)

## 2013-07-31 MED ORDER — IBUPROFEN 200 MG PO TABS: ORAL_TABLET | ORAL | Status: DC

## 2013-07-31 MED ORDER — PHENAZOPYRIDINE HCL 200 MG PO TABS
200.0000 mg | ORAL_TABLET | Freq: Three times a day (TID) | ORAL | Status: DC
  Administered 2013-07-31: 200 mg via ORAL
  Filled 2013-07-31 (×4): qty 1

## 2013-07-31 MED ORDER — OXYCODONE-ACETAMINOPHEN 5-325 MG PO TABS: 1.0000 | ORAL_TABLET | ORAL | Status: DC | PRN

## 2013-07-31 MED ORDER — PHENAZOPYRIDINE HCL 200 MG PO TABS: ORAL_TABLET | ORAL | Status: DC

## 2013-07-31 NOTE — Progress Notes (Addendum)
1 Day Post-Op  Subjective: His abdomen is sore, but he says it hurts to pee more than his abdomen hurts.  He did have a foley inserted for procedure.  Objective: Vital signs in last 24 hours: Temp:  [97.7 F (36.5 C)-98.9 F (37.2 C)] 97.8 F (36.6 C) (03/13 0627) Pulse Rate:  [58-82] 66 (03/13 0627) Resp:  [10-23] 18 (03/13 0627) BP: (99-145)/(59-96) 106/65 mmHg (03/13 0627) SpO2:  [94 %-100 %] 95 % (03/13 0627) Weight:  [111.131 kg (245 lb)] 111.131 kg (245 lb) (03/12 1500) Last BM Date: 07/29/13 1440 PO Diet: regular Afebrile, VSS No labs Intake/Output from previous day: 03/12 0701 - 03/13 0700 In: 3786.7 [P.O.:1440; I.V.:2346.7] Out: 700 [Urine:700] Intake/Output this shift:    General appearance: alert, cooperative and no distress GI: soft sore, port sites look fine. abdomen is not distended.  Lab Results:   Recent Labs  07/30/13 0850  WBC 12.0*  HGB 16.3  HCT 46.2  PLT 251    BMET  Recent Labs  07/30/13 0850  NA 139  K 4.4  CL 100  CO2 29  GLUCOSE 95  BUN 12  CREATININE 0.93  CALCIUM 9.5   PT/INR No results found for this basename: LABPROT, INR,  in the last 72 hours   Recent Labs Lab 07/30/13 0850  AST 24  ALT 18  ALKPHOS 42  BILITOT 1.0  PROT 7.3  ALBUMIN 4.2     Lipase     Component Value Date/Time   LIPASE 35 07/30/2013 0850     Studies/Results: Ct Abdomen Pelvis W Contrast  07/30/2013   CLINICAL DATA:  Abdominal pain  EXAM: CT ABDOMEN AND PELVIS WITH CONTRAST  TECHNIQUE: Multidetector CT imaging of the abdomen and pelvis was performed using the standard protocol following bolus administration of intravenous contrast. Oral contrast was also administered.  CONTRAST:  100mL OMNIPAQUE IOHEXOL 300 MG/ML  SOLN  COMPARISON:  None.  FINDINGS: Lung bases are clear.  No focal liver lesions are identified. There is no biliary duct dilatation. Gallbladder wall is not thickened.  Spleen, pancreas, adrenals appear normal.  Kidneys bilaterally  show no mass, calculus, or hydronephrosis on either side. There is no ureteral calculus or ureterectasis on either side.  In the pelvis, the urinary bladder wall is normal in thickness. There E bladder is midline. There is no pelvic mass or fluid collection.  The appendix is dilated with thickened wall and surrounding mesenteric inflammation consistent with acute appendiceal inflammation.  There is no bowel obstruction.  No free air or portal venous air.  There is no ascites, adenopathy, or abscess in the abdomen or pelvis. Aorta shows no evidence of aneurysm. There are no blastic or lytic bone lesions.  IMPRESSION: Evidence of acute appendiceal inflammation. No frank abscess. No bowel obstruction. Study otherwise unremarkable.  Critical Value/emergent results were called by telephone at the time of interpretation on 07/30/2013 at 10:11 AM to Dr. Randa SpikeFORREST, Romeo AppleHARRISON , who verbally acknowledged these results.   Electronically Signed   By: Bretta BangWilliam  Woodruff M.D.   On: 07/30/2013 10:12    Medications: . ondansetron (ZOFRAN) IV  4 mg Intravenous 4 times per day    Assessment/Plan Acute appendicitis;  S/p laparoscopic appendectomy 07/30/13, Dr. Axel FillerArmando Ramirez  GERD  Possible sleep apnea   Plan;  Recheck his urine, it was clear yesterday.  I may send him home on some pyridium and cipro for possible uti. I will check back on him after lunch.  Repeat UA is clear, he  still has some discomfort voiding but is voiding easily.  He is ready for d/c.  LOS: 1 day    Matthew Vazquez 07/31/2013

## 2013-07-31 NOTE — Progress Notes (Signed)
I have seen and examined the pt and agree with PA-Jenning's progress note. OK for DC

## 2013-07-31 NOTE — Progress Notes (Signed)
I have seen and examined the pt and agree with PA-Jenning's progress note. Pt doing well Burning on Urination likely related to foley intraop Home later today

## 2013-08-05 ENCOUNTER — Telehealth (INDEPENDENT_AMBULATORY_CARE_PROVIDER_SITE_OTHER): Payer: Self-pay | Admitting: *Deleted

## 2013-08-05 NOTE — Telephone Encounter (Signed)
Wife called to report that patient has a hard area, some redness, tenderness, and swelling at the site of his IV.  Explained that this sounds like the IV could have leaked, phlebitis.  Suggested they try Ibuprofen, warm compresses and massage as tolerated.  Wife states understanding and agreeable at this time.  I explained that I would send a message to Dr. Derrell Lollingamirez to make sure this would be his thoughts and to see if he has any further suggestions.  Explained that we will only call them back if Dr. Derrell Lollingamirez has other suggestions.

## 2013-08-05 NOTE — Telephone Encounter (Signed)
I would agree 

## 2013-08-12 ENCOUNTER — Telehealth (INDEPENDENT_AMBULATORY_CARE_PROVIDER_SITE_OTHER): Payer: Self-pay | Admitting: *Deleted

## 2013-08-12 NOTE — Telephone Encounter (Signed)
Wife called to report that when the patient had a BM today he noticed it was darker in color.  Patient is scheduled for PO appt with Dr. Derrell Lollingamirez tomorrow.  Explained that I would send a message to make Dr. Derrell Lollingamirez aware of the symptom so he can address it tomorrow.  Patient did not have any further symptoms to go along with this.  Wife states understanding and agreeable with plan at this time.

## 2013-08-13 ENCOUNTER — Encounter (INDEPENDENT_AMBULATORY_CARE_PROVIDER_SITE_OTHER): Payer: Self-pay | Admitting: General Surgery

## 2013-08-13 ENCOUNTER — Ambulatory Visit (INDEPENDENT_AMBULATORY_CARE_PROVIDER_SITE_OTHER): Payer: BC Managed Care – PPO | Admitting: General Surgery

## 2013-08-13 VITALS — BP 126/80 | HR 80 | Temp 97.8°F | Resp 16 | Ht 73.0 in | Wt 227.0 lb

## 2013-08-13 DIAGNOSIS — Z9889 Other specified postprocedural states: Secondary | ICD-10-CM

## 2013-08-13 NOTE — Progress Notes (Signed)
Patient ID: Matthew OvensGabriel Vazquez, male   DOB: 1980/09/15, 33 y.o.   MRN: [id number] Post op course The patient is a 33 year old male status post lap appendectomy. The patient has been doing well postoperatively.  Of note the patient states he had some dark/black-colored stool yesterday. He states that this is an ongoing prior to surgery. He states he was tested for H. Pylori were negative. Patient has never had endoscopy.  On Exam: Her wounds are clean dry and intact  Pathology:   Acute appendicitis.  This was discussed with the patient.  Assessment and Plan 33 year old male status post lap appendectomy. 1. We discussed weightlifter restrictions. 2. We'll have patient followup [facility] and for followup of his dark-colored stool.   Axel FillerArmando Jarion Hawthorne, MD [facility] General [facility] Invasive Surgery Trauma & Emergency Surgery

## 2014-02-03 ENCOUNTER — Ambulatory Visit (INDEPENDENT_AMBULATORY_CARE_PROVIDER_SITE_OTHER): Payer: BC Managed Care – PPO | Admitting: Emergency Medicine

## 2014-02-03 ENCOUNTER — Ambulatory Visit (INDEPENDENT_AMBULATORY_CARE_PROVIDER_SITE_OTHER): Payer: BC Managed Care – PPO

## 2014-02-03 VITALS — BP 120/78 | HR 77 | Temp 98.5°F | Resp 16 | Ht 73.0 in | Wt 236.2 lb

## 2014-02-03 DIAGNOSIS — M2390 Unspecified internal derangement of unspecified knee: Secondary | ICD-10-CM

## 2014-02-03 MED ORDER — OXYCODONE-ACETAMINOPHEN 5-325 MG PO TABS: 1.0000 | ORAL_TABLET | ORAL | Status: AC | PRN

## 2014-02-03 NOTE — Patient Instructions (Signed)
Knee, Cartilage (Meniscus) Injury  It is suspected that you have a torn cartilage (meniscus) in your knee. The menisci are made of tough cartilage and fit between the surfaces of the thigh and leg bones. The menisci are C-shaped and have a wedged profile. The wedged profile helps the stability of the joint by keeping the rounded femur surface from sliding off the flat tibial surface. The menisci are fed (nourished) by small blood vessels, but there is also a large area at the inner edge of the meniscus that does not have a good blood supply (avascular). This presents a problem when there is an injury to the meniscus because areas without good blood supply heal poorly. As a result when there is a torn cartilage in the knee, surgery is often required to fix it. This is usually done with a surgical procedure less invasive than open surgery (arthroscopy). Some times open surgery of the knee is required if there is other damage.  PURPOSE OF THE MENISCUS  The medial meniscus rests on the medial tibial plateau. The tibia is the large bone in your lower leg (the shin bone). The medial tibial plateau is the upper end of the bone making up the inner part of your knee. The lateral meniscus serves the same purpose and is located on the outside of the knee. The menisci help to distribute your body weight across the knee joint; they act as shock absorbers. Without the meniscus present, the weight of your body would be unevenly applied to the bones in your legs (the femur and tibia). The femur is the large bone in your thigh. This uneven weight distribution would cause increased wear and tear on the cartilage lining the joint surfaces, leading to early damage (arthritis) of these areas. The presence of the menisci cartilage is necessary for a healthy knee.  PURPOSE OF THE KNEE CARTILAGE  The knee joint is made up of three bones: the thigh bone (femur), the shin bone (tibia), and the knee cap (patella). The surfaces of these bones  at the knee joint are covered with cartilage called articular cartilage. This smooth, slippery surface allows the bones to slide against each other without causing bone damage. The meniscus sits between these cartilaginous surfaces of the bones. It distributes the weight evenly in the joints and helps with the stability of the joint (keeps the joint steady).  HOME CARE INSTRUCTIONS  · Use crutches and external braces as instructed.  · Once home, an ice pack applied to your injured knee may help with discomfort and keep the swelling down. An ice pack can be used for the first couple of days or as instructed.  · Only take over-the-counter or prescription medicines for pain, discomfort, or fever as directed by your caregiver.  · Call if you do not have relief of pain with medications or if there is increasing in pain.  · Call if your foot becomes cold or blue.  · You may resume normal diet and activities as directed.  · Make sure to keep your appointments with your follow-up caregiver. This injury may require further evaluation and treatment beyond the temporary treatment given today.  Document Released: 07/28/2002 Document Revised: 09/21/2013 Document Reviewed: 11/19/2008  ExitCare® Patient Information ©2015 [employer]. This information is not intended to replace advice given to you by your health care provider. Make sure you discuss any questions you have with your health care provider.

## 2014-02-03 NOTE — Progress Notes (Addendum)
[facility] [address], [place] [address] [phone]  Date:  02/03/2014   Name:  Matthew Vazquez   DOB:  12-10-1980   MRN:  [id number]  PCP:  PROVIDER NOT IN SYSTEM    Chief Complaint: Knee Injury   History of Present Illness:  Matthew Vazquez is a 33 y.o. very pleasant male patient who presents with the following:  Injured his right knee last night at a youth football practice.  Says he was running and planted his foot.  He says he cut in the opposite direction Immediately felt pain in knee,  Some swelling. Has pain with ambulation and is unable to comfortably bear weight.  No history of prior injury Says his knee pops and "gives out.".  No locking No improvement with over the counter medications or other home remedies.  Denies other complaint or health concern today.   Patient Active Problem List   Diagnosis Date Noted  . Appendicitis 07/30/2013  . Acute appendicitis 07/30/2013    No past medical history on file.  Past Surgical History  Procedure Laterality Date  . Laparoscopic appendectomy N/A 07/30/2013    Procedure: APPENDECTOMY LAPAROSCOPIC;  Surgeon: Axel Filler, MD;  Location: [facility];  Service: General;  Laterality: N/A;    History  Substance Use Topics  . Smoking status: Former Smoker    Types: Cigarettes  . Smokeless tobacco: Never Used  . Alcohol Use: Yes     Comment: 12 a week    No family history on file.  No Known Allergies  Medication list has been reviewed and updated.  Current Outpatient Prescriptions on File Prior to Visit  Medication Sig Dispense Refill  . Cholecalciferol (VITAMIN D3) 2000 UNITS TABS Take 2,000 Units by mouth daily.      Chilton Si Tea, Camillia sinensis, (GREEN TEA PO) Take 1 tablet by mouth daily.      Marland Kitchen L-Arginine 500 MG TABS Take 500 mg by mouth daily.      . L-Glutamine 500 MG TABS Take 500 mg by mouth daily.      . LevOCARNitine (L-CARNITINE) 500 MG TABS Take 500 mg by mouth daily.       . Multiple Vitamins-Minerals (MULTIVITAMIN WITH MINERALS) tablet Take 1 tablet by mouth daily.      . Omega-3 Fatty Acids (OMEGA-3 FISH OIL PO) Take 1 capsule by mouth daily.       No current facility-administered medications on file prior to visit.    Review of Systems:  As per HPI, otherwise negative.    Physical Examination: Filed Vitals:   02/03/14 1828  BP: 120/78  Pulse: 77  Temp: 98.5 F (36.9 C)  Resp: 16   Filed Vitals:   02/03/14 1828  Height:  (1.854 m)  Weight: 236 lb 3.2 oz (107.14 kg)   Body mass index is 31.17 kg/(m^2). Ideal Body Weight: Weight in (lb) to have BMI = 25: 189.1   GEN: WDWN, NAD, Non-toxic, Alert & Oriented x 3 HEENT: Atraumatic, Normocephalic.  Ears and Nose: No external deformity. EXTR: No clubbing/cyanosis/edema NEURO: Normal gait.  PSYCH: Normally interactive. Conversant. Not depressed or anxious appearing.  Calm demeanor.  RIGHT knee:  Joint stable with minimal effusion.  No ecchymosis.  Short 10 degrees of full extension and cannot flex past 90 degrees.     Assessment and Plan: Meniscus tear Crutches RICE MR   Signed,  Phillips Odor, MD   [facility] reading (PRIMARY) by  Dr. Dareen Piano.  negative.

## 2014-02-04 ENCOUNTER — Other Ambulatory Visit: Payer: Self-pay | Admitting: Emergency Medicine

## 2014-02-04 DIAGNOSIS — M2390 Unspecified internal derangement of unspecified knee: Secondary | ICD-10-CM

## 2014-02-08 ENCOUNTER — Other Ambulatory Visit: Payer: BC Managed Care – PPO

## 2014-02-08 ENCOUNTER — Other Ambulatory Visit (HOSPITAL_COMMUNITY): Payer: Self-pay | Admitting: Diagnostic Radiology

## 2014-02-09 ENCOUNTER — Ambulatory Visit
Admission: RE | Admit: 2014-02-09 | Discharge: 2014-02-09 | Disposition: A | Discharge status: Home / Self Care | Payer: BC Managed Care – PPO | Source: Ambulatory Visit | Attending: Emergency Medicine | Admitting: Emergency Medicine

## 2014-02-09 ENCOUNTER — Telehealth: Payer: Self-pay | Admitting: Radiology

## 2014-02-09 ENCOUNTER — Other Ambulatory Visit: Payer: Self-pay | Admitting: Emergency Medicine

## 2014-02-09 DIAGNOSIS — M2390 Unspecified internal derangement of unspecified knee: Secondary | ICD-10-CM

## 2014-02-09 DIAGNOSIS — S83519A Sprain of anterior cruciate ligament of unspecified knee, initial encounter: Secondary | ICD-10-CM

## 2014-02-09 NOTE — Telephone Encounter (Signed)
Spoke with patient concerning his knee.  Matthew Vazquez wonders if Matthew Vazquez should be going to work?  Should Matthew Vazquez be written out until Matthew Vazquez sees ortho.  Matthew Vazquez plans to go to work but Matthew Vazquez is in pain and wants to make sure Dr Dareen Piano is ok with him being on his knee.  Matthew Vazquez will continue to wear his brace and is awaiting referral to ortho.

## 2014-02-09 NOTE — Telephone Encounter (Signed)
Probably should stay out unless he can work comfortably with brace.  His choice.

## 2014-02-10 NOTE — Telephone Encounter (Signed)
Left message on machine to call back  

## 2015-01-11 IMAGING — CT CT ABD-PELV W/ CM
1 series · 15 of 32 positions shown, 19 images · IV contrast (omnipaque)
Comparison: None.

CLINICAL DATA: Abdominal pain

EXAM:
CT ABDOMEN AND PELVIS WITH CONTRAST
TECHNIQUE: Multidetector CT imaging of the abdomen and pelvis was performed
using the standard protocol following bolus administration of
intravenous contrast. Oral contrast was also administered.
CONTRAST:  100mL OMNIPAQUE IOHEXOL 300 MG/ML  SOLN

[Series 2: abd/pel with · axial · 0.81mm/px · z∈[+1133,+1578]mm · 15 of 100 slices shown, 19 images]
[im 7/100  soft-tissue]
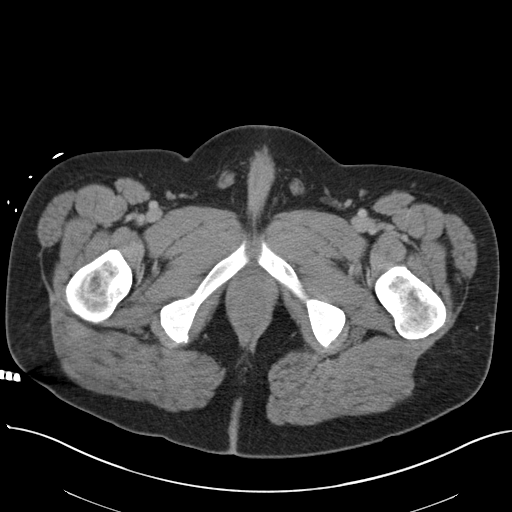
[im 7/100  bone]
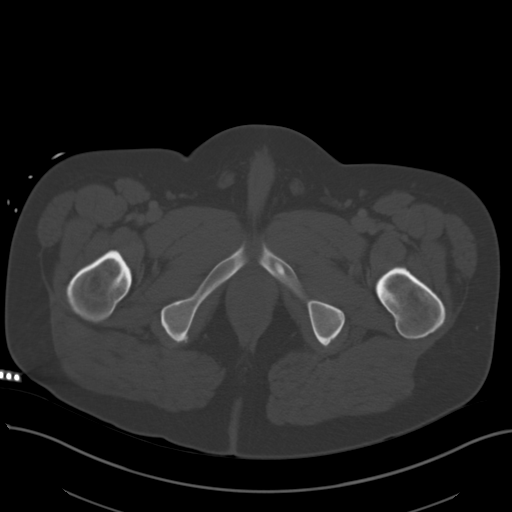
[im 13/100  soft-tissue]
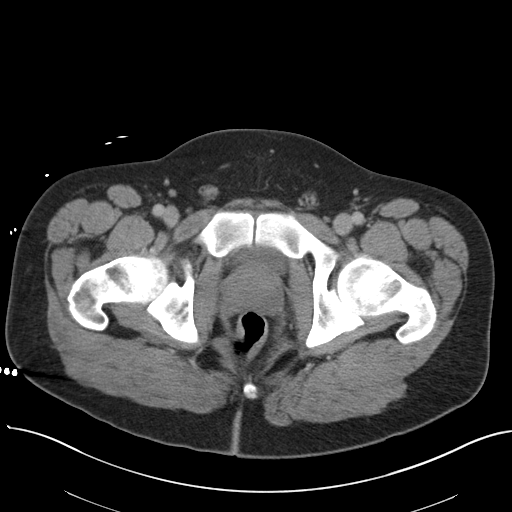
[im 20/100  soft-tissue]
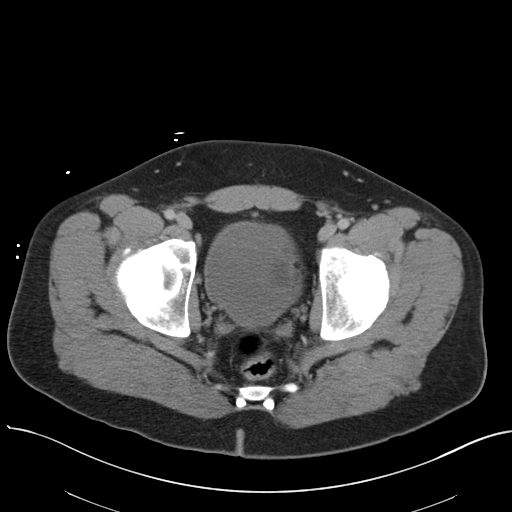
[im 29/100  soft-tissue]
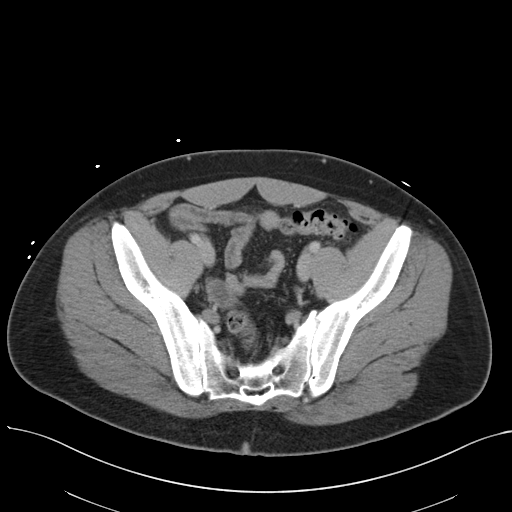
[im 36/100  soft-tissue]
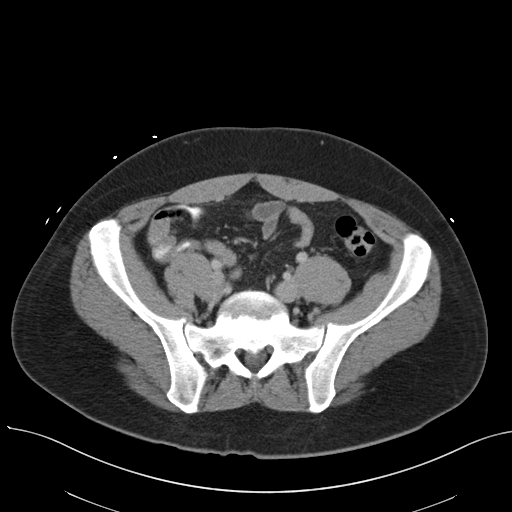
[im 42/100  soft-tissue]
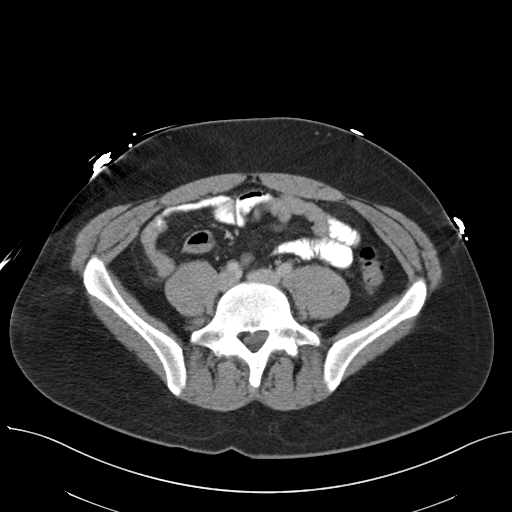
[im 52/100  soft-tissue]
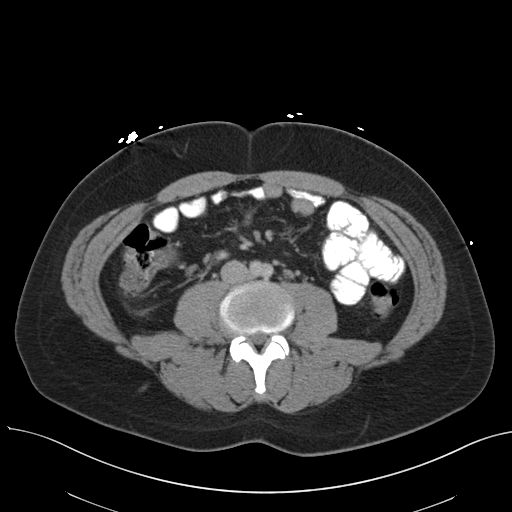
[im 58/100  soft-tissue]
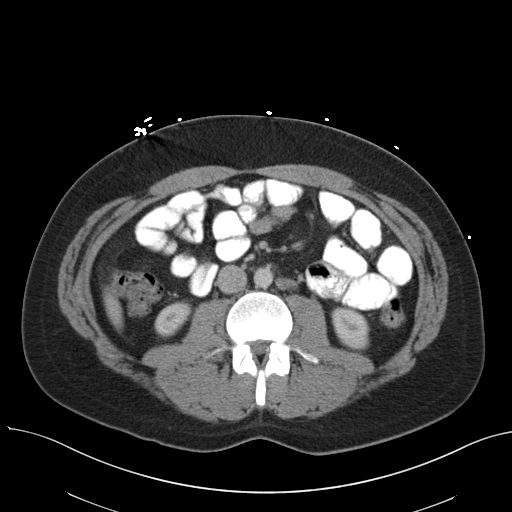
[im 64/100  soft-tissue]
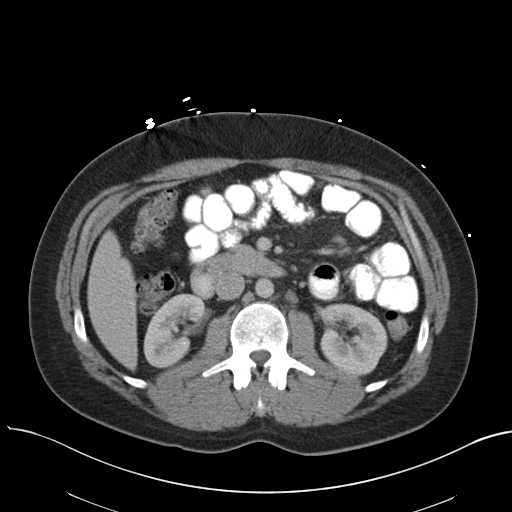
[im 64/100  bone]
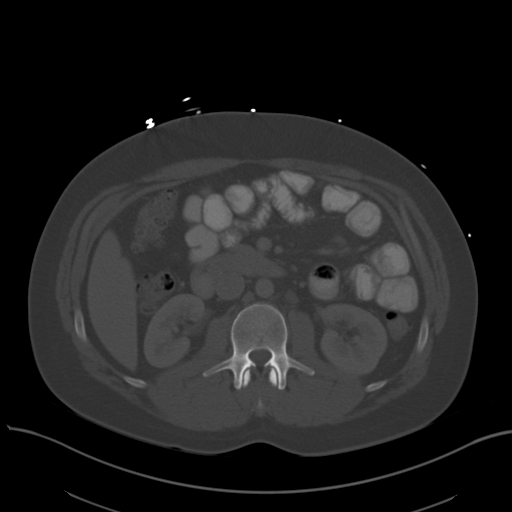
[im 71/100  soft-tissue]
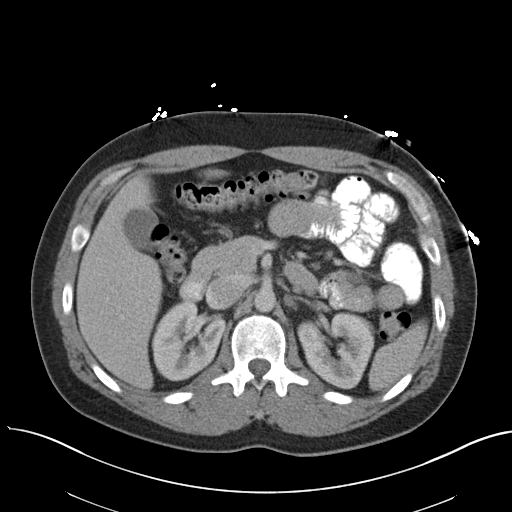
[im 80/100  soft-tissue]
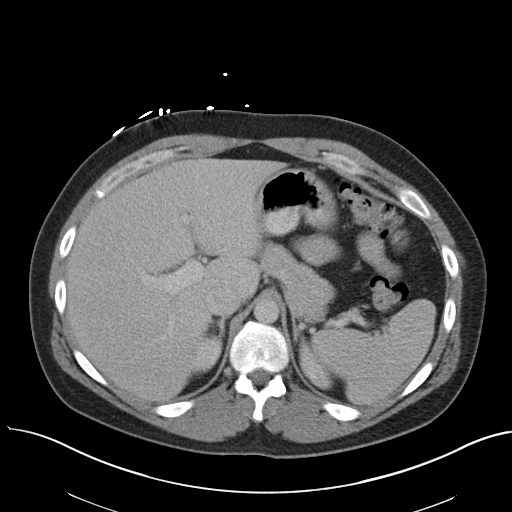
[im 87/100  soft-tissue]
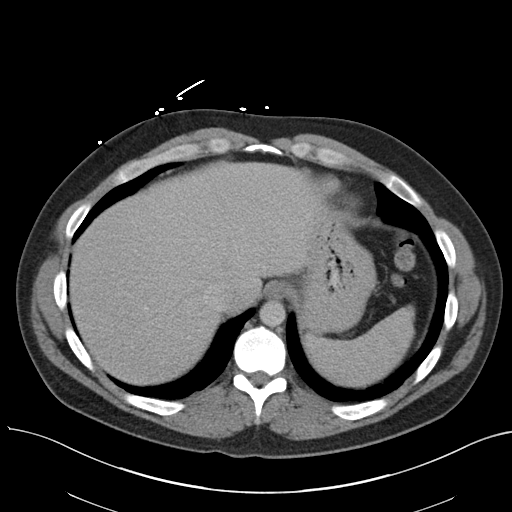
[im 87/100  lung]
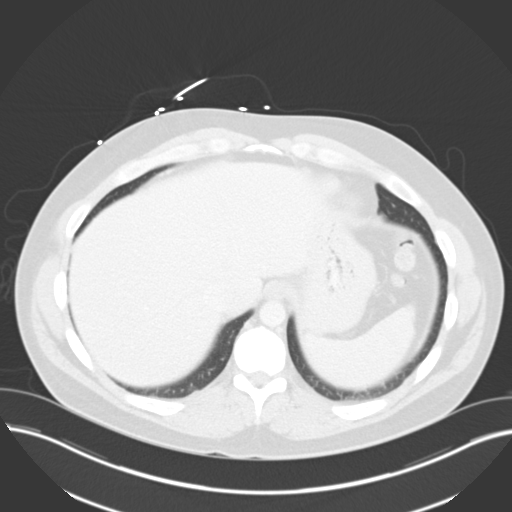
[im 90/100  lung]
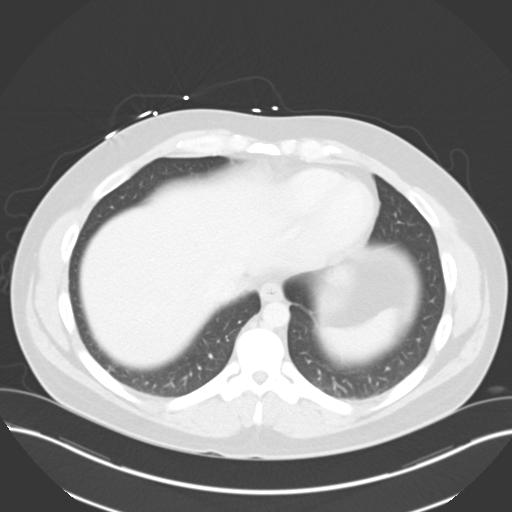
[im 93/100  soft-tissue]
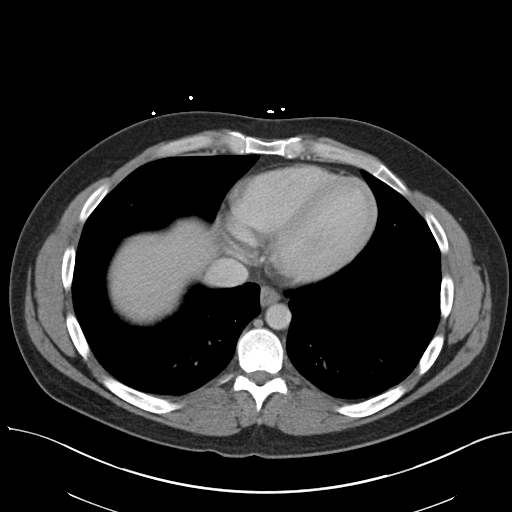
[im 93/100  lung]
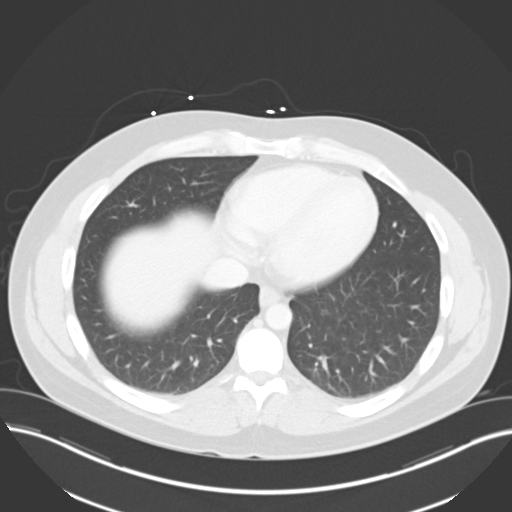
[im 96/100  lung]
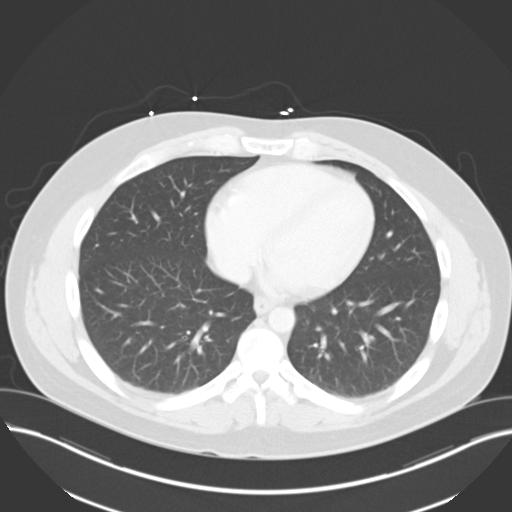

[15 of 32 positions shown; findings below may reference images not displayed]

FINDINGS: Lung bases are clear.

No focal liver lesions are identified. There is no biliary duct
dilatation. Gallbladder wall is not thickened.

Spleen, pancreas, adrenals appear normal.

Kidneys bilaterally show no mass, calculus, or hydronephrosis on
either side. There is no ureteral calculus or ureterectasis on
either side.

In the pelvis, the urinary bladder wall is normal in thickness.
There E bladder is midline. There is no pelvic mass or fluid
collection.

The appendix is dilated with thickened wall and surrounding
mesenteric inflammation consistent with acute appendiceal
inflammation.

There is no bowel obstruction.  No free air or portal venous air.

There is no ascites, adenopathy, or abscess in the abdomen or
pelvis. Aorta shows no evidence of aneurysm. There are no blastic or
lytic bone lesions.
IMPRESSION: Evidence of acute appendiceal inflammation. No frank abscess. No
bowel obstruction. Study otherwise unremarkable.

Critical Value/emergent results were called by telephone at the time
of interpretation on 07/30/2013 at [DATE] to Dr. KACA, YIORYOS
, who verbally acknowledged these results.

## 2022-07-23 ENCOUNTER — Ambulatory Visit
Admission: EM | Admit: 2022-07-23 | Discharge: 2022-07-23 | Disposition: A | Discharge status: Home / Self Care | Payer: Commercial Managed Care - PPO | Attending: Internal Medicine | Admitting: Internal Medicine

## 2022-07-23 DIAGNOSIS — R197 Diarrhea, unspecified: Secondary | ICD-10-CM | POA: Diagnosis not present

## 2022-07-23 HISTORY — DX: Anxiety disorder, unspecified: F41.9

## 2022-07-23 LAB — C DIFFICILE QUICK SCREEN W PCR REFLEX
C Diff antigen: NEGATIVE
C Diff interpretation: NOT DETECTED
C Diff toxin: NEGATIVE

## 2022-07-23 NOTE — ED Provider Notes (Addendum)
[facility]    CSN: [id number] Arrival date & time: 07/23/22  1545      History   Chief Complaint Chief Complaint  Patient presents with   Diarrhea    HPI Matthew Vazquez is a 42 y.o. male.   Patient presents with approximately 3-week history of diarrhea.  Patient reports that he has about 5-6 episodes of diarrhea a day since symptoms started.  States that symptoms started along with upper respiratory symptoms and cough.  He was treated with azithromycin and Augmentin for suspicion of sinusitis and bronchitis on 2/20 and 2/22. Has completed these medications. Patient denies any blood in stool.  Reports minimal nausea but no vomiting.  Denies any associated abdominal pain.  Denies any persistent or recent fevers.  Patient does report that he has a history of chronic diarrhea but symptoms have been increased in frequency over the past 3 weeks.  He had a colonoscopy about 4 to 5 months ago with gastrointestinal specialist and no abnormalities were found for patient. Denies any associated dizziness.    Diarrhea   Past Medical History:  Diagnosis Date   Anxiety     Patient Active Problem List   Diagnosis Date Noted   Appendicitis 07/30/2013   Acute appendicitis 07/30/2013    Past Surgical History:  Procedure Laterality Date   ANTERIOR CRUCIATE LIGAMENT REPAIR     LAPAROSCOPIC APPENDECTOMY N/A 07/30/2013   Procedure: APPENDECTOMY LAPAROSCOPIC;  Surgeon: Ralene Ok, MD;  Location: [facility];  Service: General;  Laterality: N/A;       Home Medications    Prior to Admission medications   Medication Sig Start Date End Date Taking? Authorizing Provider  amoxicillin (AMOXIL) 875 MG tablet Take 875 mg by mouth 2 (two) times daily.   Yes [provider]  azithromycin (ZITHROMAX) 500 MG tablet Take by mouth daily.   Yes [provider]  Cholecalciferol (VITAMIN D3) 2000 UNITS TABS Take 2,000 Units by mouth daily. Patient not taking: Reported on  07/23/2022    [provider]  Nyoka Cowden Tea, Camillia sinensis, (GREEN TEA PO) Take 1 tablet by mouth daily. Patient not taking: Reported on 07/23/2022    [provider]  L-Arginine 500 MG TABS Take 500 mg by mouth daily. Patient not taking: Reported on 07/23/2022    [provider]  L-Glutamine 500 MG TABS Take 500 mg by mouth daily. Patient not taking: Reported on 07/23/2022    [provider]  LevOCARNitine (L-CARNITINE) 500 MG TABS Take 500 mg by mouth daily. Patient not taking: Reported on 07/23/2022    [provider]  Multiple Vitamins-Minerals (MULTIVITAMIN WITH MINERALS) tablet Take 1 tablet by mouth daily. Patient not taking: Reported on 07/23/2022    [provider]  Omega-3 Fatty Acids (OMEGA-3 FISH OIL PO) Take 1 capsule by mouth daily. Patient not taking: Reported on 07/23/2022    [provider]  oxyCODONE-acetaminophen (ROXICET) 5-325 MG per tablet Take 1 tablet by mouth every 4 (four) hours as needed for severe pain. Patient not taking: Reported on 07/23/2022 02/03/14   Roselee Culver, MD    Family History History reviewed. No pertinent family history.  Social History Social History   Tobacco Use   Smoking status: Former    Types: Cigarettes   Smokeless tobacco: Never  Substance Use Topics   Alcohol use: Yes    Comment: 12 a week   Drug use: No     Allergies   Patient has no known allergies.  Review of Systems Review of Systems Per HPI  Physical Exam Triage Vital Signs ED Triage Vitals  Enc Vitals Group     BP 07/23/22 1715 (!) 138/97     Pulse Rate 07/23/22 1715 93     Resp 07/23/22 1715 12     Temp 07/23/22 1715 97.9 F (36.6 C)     Temp Source 07/23/22 1715 Oral     SpO2 07/23/22 1715 97 %     Weight --      Height --      Head Circumference --      Peak Flow --      Pain Score 07/23/22 1712 0     Pain Loc --      Pain Edu? --      Excl. in [place]? --    No data found.  Updated Vital  Signs BP (!) 138/97 (BP Location: Left Arm)   Pulse 93   Temp 97.9 F (36.6 C) (Oral)   Resp 12   SpO2 97%   Visual Acuity Right Eye Distance:   Left Eye Distance:   Bilateral Distance:    Right Eye Near:   Left Eye Near:    Bilateral Near:     Physical Exam Constitutional:      General: He is not in acute distress.    Appearance: Normal appearance. He is not toxic-appearing or diaphoretic.  HENT:     Head: Normocephalic and atraumatic.     Mouth/Throat:     Mouth: Mucous membranes are moist.     Pharynx: No posterior oropharyngeal erythema.  Eyes:     Extraocular Movements: Extraocular movements intact.     Conjunctiva/sclera: Conjunctivae normal.  Cardiovascular:     Rate and Rhythm: Normal rate and regular rhythm.     Pulses: Normal pulses.     Heart sounds: Normal heart sounds.  Pulmonary:     Effort: Pulmonary effort is normal. No respiratory distress.     Breath sounds: Normal breath sounds.  Abdominal:     General: Bowel sounds are normal. There is no distension.     Palpations: Abdomen is soft.     Tenderness: There is no abdominal tenderness.  Neurological:     General: No focal deficit present.     Mental Status: He is alert and oriented to person, place, and time. Mental status is at baseline.  Psychiatric:        Mood and Affect: Mood normal.        Behavior: Behavior normal.        Thought Content: Thought content normal.        Judgment: Judgment normal.      UC Treatments / Results  Labs (all labs ordered are listed, but only abnormal results are displayed) Labs Reviewed  C DIFFICILE QUICK SCREEN W PCR REFLEX    GASTROINTESTINAL PANEL BY PCR, STOOL (REPLACES STOOL CULTURE)  CBC  COMPREHENSIVE METABOLIC PANEL    EKG   Radiology No results found.  Procedures Procedures (including critical care time)  Medications Ordered in UC Medications - No data to display  Initial Impression / Assessment and Plan / UC Course  I have reviewed  the triage vital signs and the nursing notes.  Pertinent labs & imaging results that were available during my care of the patient were reviewed by me and considered in my medical decision making (see chart for details).     Patient is having persistent diarrhea for multiple weeks which is most  likely viral in nature given symptoms started along with upper respiratory symptoms and cough.  There are no signs of acute abdomen or dehydration exam, vital signs are stable, and mucous membranes are moist.  Therefore, do not think that IV fluid hydration or emergent evaluation is necessary. Will obtain CMP and CBC. Stool sample obtained today. Awaiting result.  I am mildly concerned about C. difficile given patient was recently treated with antibiotics.  Advised strict ER precautions.  Also advised patient to call established gastrointestinal specialist to follow-up for further evaluation and management.  Patient verbalized understanding and was agreeable with plan. Final Clinical Impressions(s) / UC Diagnoses   Final diagnoses:  Diarrhea, unspecified type     Discharge Instructions      Follow-up with your gastrointestinal specialist.  Blood work is pending.  Will call if it is abnormal.  Please obtain a stool sample and bring back to [place] so we can send off to the lab for testing.    ED Prescriptions   None    PDMP not reviewed this encounter.   Teodora Medici, [place] 07/23/22 [place], [place], [place] 07/23/22 [phone]

## 2022-07-23 NOTE — ED Triage Notes (Signed)
Pt presents with c/o diarrhea x 1 week.

## 2022-07-23 NOTE — Discharge Instructions (Signed)
Follow-up with your gastrointestinal specialist.  Blood work is pending.  Will call if it is abnormal.  Please obtain a stool sample and bring back to [place] so we can send off to the lab for testing.

## 2022-07-24 LAB — GASTROINTESTINAL PANEL BY PCR, STOOL (REPLACES STOOL CULTURE)

## 2022-07-25 LAB — COMPREHENSIVE METABOLIC PANEL
ALT: 36 IU/L (ref 0–44)
AST: 29 IU/L (ref 0–40)
Albumin/Globulin Ratio: 1.8 (ref 1.2–2.2)
Albumin: 4.8 g/dL (ref 4.1–5.1)
Alkaline Phosphatase: 66 IU/L (ref 44–121)
BUN/Creatinine Ratio: 14 (ref 9–20)
BUN: 14 mg/dL (ref 6–24)
Bilirubin Total: 0.6 mg/dL (ref 0.0–1.2)
CO2: 16 mmol/L — ABNORMAL LOW (ref 20–29)
Calcium: 9.3 mg/dL (ref 8.7–10.2)
Chloride: 104 mmol/L (ref 96–106)
Creatinine, Ser: 0.97 mg/dL (ref 0.76–1.27)
Globulin, Total: 2.7 g/dL (ref 1.5–4.5)
Glucose: 66 mg/dL — ABNORMAL LOW (ref 70–99)
Potassium: 3.7 mmol/L (ref 3.5–5.2)
Sodium: 142 mmol/L (ref 134–144)
Total Protein: 7.5 g/dL (ref 6.0–8.5)
eGFR: 101 mL/min/{1.73_m2} (ref >59)

## 2022-07-25 LAB — CBC
Hematocrit: 50.6 % (ref 37.5–51.0)
Hemoglobin: 16.9 g/dL (ref 13.0–17.7)
MCH: 31.7 pg (ref 26.6–33.0)
MCHC: 33.4 g/dL (ref 31.5–35.7)
MCV: 95 fL (ref 79–97)
Platelets: 282 10*3/uL (ref 150–450)
RBC: 5.33 x10E6/uL (ref 4.14–5.80)
RDW: 12.8 % (ref 11.6–15.4)
WBC: 6.5 10*3/uL (ref 3.4–10.8)

## 2023-10-05 ENCOUNTER — Emergency Department (HOSPITAL_COMMUNITY)
Admission: EM | Admit: 2023-10-05 | Discharge: 2023-10-05 | Disposition: A | Discharge status: Home / Self Care | Attending: Emergency Medicine | Admitting: Emergency Medicine

## 2023-10-05 ENCOUNTER — Emergency Department (HOSPITAL_COMMUNITY)

## 2023-10-05 ENCOUNTER — Other Ambulatory Visit: Payer: Self-pay

## 2023-10-05 DIAGNOSIS — G44019 Episodic cluster headache, not intractable: Secondary | ICD-10-CM | POA: Insufficient documentation

## 2023-10-05 DIAGNOSIS — I10 Essential (primary) hypertension: Secondary | ICD-10-CM | POA: Insufficient documentation

## 2023-10-05 DIAGNOSIS — R7309 Other abnormal glucose: Secondary | ICD-10-CM | POA: Diagnosis not present

## 2023-10-05 DIAGNOSIS — R519 Headache, unspecified: Secondary | ICD-10-CM | POA: Diagnosis present

## 2023-10-05 LAB — COMPREHENSIVE METABOLIC PANEL WITH GFR
ALT: 38 U/L (ref 0–44)
AST: 35 U/L (ref 15–41)
Albumin: 4.4 g/dL (ref 3.5–5.0)
Alkaline Phosphatase: 52 U/L (ref 38–126)
Anion gap: 11 (ref 5–15)
BUN: 12 mg/dL (ref 6–20)
CO2: 22 mmol/L (ref 22–32)
Calcium: 9.8 mg/dL (ref 8.9–10.3)
Chloride: 104 mmol/L (ref 98–111)
Creatinine, Ser: 1 mg/dL (ref 0.61–1.24)
GFR, Estimated: >60 mL/min (ref >60)
Glucose, Bld: 106 mg/dL — ABNORMAL HIGH (ref 70–99)
Potassium: 4.2 mmol/L (ref 3.5–5.1)
Sodium: 137 mmol/L (ref 135–145)
Total Bilirubin: 0.9 mg/dL (ref 0.0–1.2)
Total Protein: 8.1 g/dL (ref 6.5–8.1)

## 2023-10-05 LAB — CBC WITH DIFFERENTIAL/PLATELET
Abs Immature Granulocytes: 0.03 10*3/uL (ref 0.00–0.07)
Basophils Absolute: 0.1 10*3/uL (ref 0.0–0.1)
Basophils Relative: 1 %
Eosinophils Absolute: 0.5 10*3/uL (ref 0.0–0.5)
Eosinophils Relative: 6 %
HCT: 46.3 % (ref 39.0–52.0)
Hemoglobin: 16.4 g/dL (ref 13.0–17.0)
Immature Granulocytes: 0 %
Lymphocytes Relative: 27 %
Lymphs Abs: 2.3 10*3/uL (ref 0.7–4.0)
MCH: 31.2 pg (ref 26.0–34.0)
MCHC: 35.4 g/dL (ref 30.0–36.0)
MCV: 88.2 fL (ref 80.0–100.0)
Monocytes Absolute: 0.5 10*3/uL (ref 0.1–1.0)
Monocytes Relative: 6 %
Neutro Abs: 5.2 10*3/uL (ref 1.7–7.7)
Neutrophils Relative %: 60 %
Platelets: 288 10*3/uL (ref 150–400)
RBC: 5.25 MIL/uL (ref 4.22–5.81)
RDW: 12.1 % (ref 11.5–15.5)
WBC: 8.6 10*3/uL (ref 4.0–10.5)
nRBC: 0 % (ref 0.0–0.2)

## 2023-10-05 MED ORDER — SUMATRIPTAN SUCCINATE 50 MG PO TABS: 50.0000 mg | ORAL_TABLET | ORAL | 0 refills | Status: AC | PRN

## 2023-10-05 MED ORDER — IOHEXOL 350 MG/ML SOLN
75.0000 mL | Freq: Once | INTRAVENOUS | Status: AC | PRN
  Administered 2023-10-05: 75 mL via INTRAVENOUS

## 2023-10-05 MED ORDER — VERAPAMIL HCL 40 MG PO TABS: 40.0000 mg | ORAL_TABLET | Freq: Three times a day (TID) | ORAL | 0 refills | Status: AC

## 2023-10-05 MED ORDER — METHOCARBAMOL 500 MG PO TABS: 500.0000 mg | ORAL_TABLET | Freq: Two times a day (BID) | ORAL | 0 refills | Status: AC | PRN

## 2023-10-05 MED ORDER — ACETAMINOPHEN 500 MG PO TABS
1000.0000 mg | ORAL_TABLET | Freq: Once | ORAL | Status: AC
  Administered 2023-10-05: 1000 mg via ORAL
  Filled 2023-10-05: qty 2

## 2023-10-05 MED ORDER — VERAPAMIL HCL 40 MG PO TABS: 80.0000 mg | ORAL_TABLET | Freq: Three times a day (TID) | ORAL | 0 refills | Status: AC

## 2023-10-05 MED ORDER — DEXAMETHASONE SODIUM PHOSPHATE 10 MG/ML IJ SOLN
10.0000 mg | Freq: Once | INTRAMUSCULAR | Status: AC
  Administered 2023-10-05: 10 mg via INTRAVENOUS
  Filled 2023-10-05: qty 1

## 2023-10-05 MED ORDER — LACTATED RINGERS IV BOLUS
1000.0000 mL | Freq: Once | INTRAVENOUS | Status: AC
  Administered 2023-10-05: 1000 mL via INTRAVENOUS

## 2023-10-05 MED ORDER — DIPHENHYDRAMINE HCL 50 MG/ML IJ SOLN
12.5000 mg | Freq: Once | INTRAMUSCULAR | Status: AC
  Administered 2023-10-05: 12.5 mg via INTRAVENOUS
  Filled 2023-10-05: qty 1

## 2023-10-05 MED ORDER — METOCLOPRAMIDE HCL 5 MG/ML IJ SOLN
10.0000 mg | Freq: Once | INTRAMUSCULAR | Status: AC
  Administered 2023-10-05: 10 mg via INTRAVENOUS
  Filled 2023-10-05: qty 2

## 2023-10-05 NOTE — ED Triage Notes (Signed)
 Pt c/o intermittent HA in R temple region for 3x days. No vision changes.

## 2023-10-05 NOTE — Discharge Instructions (Addendum)
 You were seen in the ER today for evaluation of your headaches. I am glad that you are feeling better!  After speaking with our neurology team, they would like to switch your blood pressure medication to something called verapamil.  Please stop taking the losartan.  You will take 40 mg of verapamil 3 times a day for the next 15 days.  If you find that your headaches are well covered with this medication, you can continue taking the 40 mg 3 times a day.  If you feel your headaches are not being well covered after this time period, you can increase to 80 mg of verapamil 3 times a day.  Please make sure that you are checking your blood pressure while on the new medication to make sure that it is staying within normal range.  I have included a blood pressure form into this chart for you to document daily.  Additionally, I am going to prescribe you a medication called sumatriptan that you can take to stop migraines if you are one.  You did have some abnormality to your CT imaging that needs to be followed up with from neurology because of your headaches as well.  I have included the information for Dr. Teddy Fear office into the discharge paperwork.  Please make sure you call to schedule an appointment.  If you have any concerns, new or worsening symptoms, please return to your nearest emergency department for reevaluation.  Contact a doctor if: Your headaches: Change. Get worse. Happen more often. Your medicines or oxygen are not helping. Get help right away if: You faint. You have weakness or lose feeling (have numbness) on one side of your body or face. You see two of everything (double vision). You vomit or feel you may vomit (nauseous), and it does not stop after many hours. You have trouble with your balance or with walking. You have trouble talking. You have neck pain or stiffness and you have a fever.

## 2023-10-05 NOTE — ED Provider Notes (Signed)
 [place] EMERGENCY DEPARTMENT AT [facility] Provider Note   CSN: [id number] Arrival date & time: 10/05/23  4098     History Chief Complaint  Patient presents with   Headache    Matthew Vazquez is a 43 y.o. male with h/o HTN, anxiety depression presents to the ER for evaluation right sided headaches for the past 3 days, points to the right temporal parietal area.  Reports that they are stabbing and quick at most last 30 seconds.  He reports that he is not waking up in the melanite and is able to sleep.  He reports a send has happened few times an hour but hide can have hours where he does not have them.  He does report that he is under a lot of stress at work.  Denies any vision changes.  Denies any trouble walking or talking.  Denies any lightheadedness or dizziness.  Reports that sometimes of the more intense episodes he can have some nausea but no vomiting.  No chest pain or abdominal pain.  No fever or neck stiffness.  He has not trialed any medications.  Reports he is compliant with his blood pressure medications.  He reports that while he is at work he does work in a [employer] and does not eat much and will occasionally drink some water.  Does drink alcohol daily with 6 beers per day.  Denies any allergies to any medications.   Headache Associated symptoms: nausea   Associated symptoms: no abdominal pain, no congestion, no diarrhea, no dizziness, no fever, no neck pain, no neck stiffness, no numbness, no photophobia, no vomiting and no weakness        Home Medications Prior to Admission medications   Medication Sig Start Date End Date Taking? Authorizing Provider  methocarbamol  (ROBAXIN ) 500 MG tablet Take 1 tablet (500 mg total) by mouth 2 (two) times daily as needed for muscle spasms. 10/05/23  Yes Spence Dux, PA-C  SUMAtriptan  (IMITREX ) 50 MG tablet Take 1 tablet (50 mg total) by mouth every 2 (two) hours as needed for migraine. May repeat in 2 hours if headache  persists or recurs. MAXIMUM dose is 200mg  (4 tablets) in 24 hours. 10/05/23  Yes Spence Dux, PA-C  verapamil  (CALAN ) 40 MG tablet Take 1 tablet (40 mg total) by mouth 3 (three) times daily for 15 days. 10/05/23 10/20/23 Yes Spence Dux, PA-C  verapamil  (CALAN ) 40 MG tablet Take 2 tablets (80 mg total) by mouth 3 (three) times daily for 15 days. If you feel that your headaches are being well controlled with the 40mg , please only take one tablet three times a day. 10/20/23 11/04/23 Yes Spence Dux, PA-C  amoxicillin (AMOXIL) 875 MG tablet Take 875 mg by mouth 2 (two) times daily.    [provider]  azithromycin (ZITHROMAX) 500 MG tablet Take by mouth daily.    [provider]  Cholecalciferol (VITAMIN D3) 2000 UNITS TABS Take 2,000 Units by mouth daily. Patient not taking: Reported on 07/23/2022    [provider]  Marrie Sizer Tea, Camillia sinensis, (GREEN TEA PO) Take 1 tablet by mouth daily. Patient not taking: Reported on 07/23/2022    [provider]  L-Arginine 500 MG TABS Take 500 mg by mouth daily. Patient not taking: Reported on 07/23/2022    [provider]  L-Glutamine 500 MG TABS Take 500 mg by mouth daily. Patient not taking: Reported on 07/23/2022    [provider]  LevOCARNitine (L-CARNITINE) 500 MG TABS Take  500 mg by mouth daily. Patient not taking: Reported on 07/23/2022    [provider]  Multiple Vitamins-Minerals (MULTIVITAMIN WITH MINERALS) tablet Take 1 tablet by mouth daily. Patient not taking: Reported on 07/23/2022    [provider]  Omega-3 Fatty Acids (OMEGA-3 FISH OIL PO) Take 1 capsule by mouth daily. Patient not taking: Reported on 07/23/2022    [provider]  oxyCODONE -acetaminophen  (ROXICET) 5-325 MG per tablet Take 1 tablet by mouth every 4 (four) hours as needed for severe pain. Patient not taking: Reported on 07/23/2022 02/03/14   Media Spikes, MD      Allergies    Patient has no known  allergies.    Review of Systems   Review of Systems  Constitutional:  Negative for chills and fever.  HENT:  Negative for congestion and rhinorrhea.   Eyes:  Negative for photophobia and visual disturbance.  Respiratory:  Negative for shortness of breath.   Cardiovascular:  Negative for chest pain.  Gastrointestinal:  Positive for nausea. Negative for abdominal pain, constipation, diarrhea and vomiting.  Musculoskeletal:  Negative for gait problem, neck pain and neck stiffness.  Neurological:  Positive for headaches. Negative for dizziness, syncope, facial asymmetry, speech difficulty, weakness, light-headedness and numbness.    Physical Exam Updated Vital Signs BP (!) 135/94 (BP Location: Left Arm)   Pulse 74   Temp 97.6 F (36.4 C) (Oral)   Resp 18   Ht 6\' 1"  (1.854 m)   Wt 108.9 kg   SpO2 100%   BMI 31.66 kg/m  Physical Exam Vitals and nursing note reviewed.  Constitutional:      General: He is not in acute distress.    Appearance: He is not toxic-appearing.  HENT:     Head: Normocephalic and atraumatic.     Comments: Scalp is nontender to palpation.  No overlying skin changes noted.    Right Ear: Tympanic membrane, ear canal and external ear normal.     Left Ear: Tympanic membrane, ear canal and external ear normal.  Eyes:     General: No visual field deficit or scleral icterus.    Extraocular Movements: Extraocular movements intact.     Pupils: Pupils are equal, round, and reactive to light.  Cardiovascular:     Rate and Rhythm: Normal rate.     Pulses:          Radial pulses are 2+ on the right side and 2+ on the left side.       Dorsalis pedis pulses are 2+ on the right side and 2+ on the left side.  Pulmonary:     Effort: Pulmonary effort is normal. No respiratory distress.  Musculoskeletal:     Cervical back: Normal range of motion. No rigidity.  Lymphadenopathy:     Cervical: No cervical adenopathy.  Skin:    General: Skin is warm and dry.  Neurological:      Mental Status: He is alert.     GCS: GCS eye subscore is 4. GCS verbal subscore is 5. GCS motor subscore is 6.     Cranial Nerves: No cranial nerve deficit, dysarthria or facial asymmetry.     Sensory: No sensory deficit.     Motor: Motor function is intact. No weakness or pronator drift.     Coordination: Coordination normal. Finger-Nose-Finger Test and Heel to Shin Test normal.     Gait: Gait normal.     ED Results / Procedures / Treatments   Labs (all labs ordered are  listed, but only abnormal results are displayed) Labs Reviewed  COMPREHENSIVE METABOLIC PANEL WITH GFR - Abnormal; Notable for the following components:      Result Value   Glucose, Bld 106 (*)    All other components within normal limits  CBC WITH DIFFERENTIAL/PLATELET    EKG None  Radiology CT ANGIO HEAD NECK W WO CM Result Date: 10/05/2023 CLINICAL DATA:  Provided history: Headache, "lightning-like" for a few seconds in right parietal/temporal area. EXAM: CT ANGIOGRAPHY HEAD AND NECK WITH AND WITHOUT CONTRAST TECHNIQUE: Multidetector CT imaging of the head and neck was performed using the standard protocol during bolus administration of intravenous contrast. Multiplanar CT image reconstructions and MIPs were obtained to evaluate the vascular anatomy. Carotid stenosis measurements (when applicable) are obtained utilizing NASCET criteria, using the distal internal carotid diameter as the denominator. RADIATION DOSE REDUCTION: This exam was performed according to the departmental dose-optimization program which includes automated exposure control, adjustment of the mA and/or kV according to patient size and/or use of iterative reconstruction technique. CONTRAST:  75mL OMNIPAQUE  IOHEXOL  350 MG/ML SOLN COMPARISON:  Non-contrast head CT performed earlier today 10/05/2023. FINDINGS: CTA NECK FINDINGS Aortic arch: Standard aortic branching. The visualized aortic arch is normal in caliber. No hemodynamically significant  innominate or proximal subclavian artery stenosis. Right carotid system: CCA and ICA patent within the neck without stenosis or significant atherosclerotic disease. No evidence of dissection. Left carotid system: CCA and ICA patent within the neck without stenosis. No evidence of dissection. Vertebral arteries: Codominant and patent within the neck without stenosis or significant atherosclerotic disease. No evidence of dissection. Skeleton: Nonspecific reversal of the expected cervical lordosis. No acute fracture or aggressive osseous lesion. Other neck: No neck mass or cervical lymphadenopathy. Upper chest: No consolidation within the imaged lung apices. Review of the MIP images confirms the above findings CTA HEAD FINDINGS Anterior circulation: The intracranial internal carotid arteries are patent. The M1 middle cerebral arteries are patent. No M2 proximal branch occlusion is identified. Moderate focal stenosis within a mid M2 left MCA branch (series 13, image 30). The anterior cerebral arteries are patent. Hypoplastic right anterior cerebral artery A1 segment. 1-2 mm laterally projecting vascular protrusion arising from the right internal carotid artery at the petrous-cavernous junction (for instance as seen on series 8, image 244). This may reflect an aneurysm or the origin of an otherwise poorly delineated small branch vessel. Posterior circulation: The intracranial vertebral arteries are patent. The basilar artery is patent. The posterior cerebral arteries are patent. Hypoplastic right P1 segment with sizable right posterior communicating artery. A left posterior communicating artery is also present. Venous sinuses: Within the limitations of contrast timing, no convincing thrombus. Review of the MIP images confirms the above findings IMPRESSION: CTA neck: The common carotid, internal carotid and vertebral arteries are patent within the neck without stenosis or significant atherosclerotic disease. No evidence  of dissection. CTA head: 1. No proximal intracranial large vessel occlusion identified. 2. 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 3. 1-2 mm vascular protrusion arising from the right internal carotid artery at the petrous-cavernous junction, which may reflect an aneurysm or the origin of an otherwise poorly delineated small branch vessel. Electronically Signed   By: Bascom Lily D.O.   On: 10/05/2023 13:09   CT Head Wo Contrast Result Date: 10/05/2023 CLINICAL DATA:  Severe right-sided headache for 3 days. EXAM: CT HEAD WITHOUT CONTRAST TECHNIQUE: Contiguous axial images were obtained from the base of the skull through  the vertex without intravenous contrast. RADIATION DOSE REDUCTION: This exam was performed according to the departmental dose-optimization program which includes automated exposure control, adjustment of the mA and/or kV according to patient size and/or use of iterative reconstruction technique. COMPARISON:  02/05/2004 FINDINGS: Brain: No evidence of intracranial hemorrhage, acute infarction, hydrocephalus, extra-axial collection, or mass lesion/mass effect. Vascular:  No hyperdense vessel or other acute findings. Skull: No evidence of fracture or other significant bone abnormality. Sinuses/Orbits:  No acute findings. Other: None. IMPRESSION: Negative noncontrast head CT. Electronically Signed   By: Marlyce Sine M.D.   On: 10/05/2023 10:34    Procedures Procedures   Medications Ordered in ED Medications  lactated ringers  bolus 1,000 mL (0 mLs Intravenous Stopped 10/05/23 1412)  dexamethasone  (DECADRON ) injection 10 mg (10 mg Intravenous Given 10/05/23 1225)  acetaminophen  (TYLENOL ) tablet 1,000 mg (1,000 mg Oral Given 10/05/23 1224)  metoCLOPramide  (REGLAN ) injection 10 mg (10 mg Intravenous Given 10/05/23 1225)  diphenhydrAMINE  (BENADRYL ) injection 12.5 mg (12.5 mg Intravenous Given 10/05/23 1225)  iohexol  (OMNIPAQUE ) 350 MG/ML injection 75 mL (75 mLs  Intravenous Contrast Given 10/05/23 1248)    ED Course/ Medical Decision Making/ A&P Clinical Course as of 10/05/23 1610  Sat Oct 05, 2023  1359 Spoke with Neurology, Dr. Evalee Hila. He thinks this sounds more like a cluster HA. Recommends switching his BP medications to Verapamil  and prescribing Sumitriptan. Recommends oxygen for treatment in the ER. Additionally would like him to follow up with Dr. Tresia Fruit in clinic for re-evaluation and follow up on imaging findings. Does not feel that additional imaging needs to be done emergently. [RR]  1542 On re-evaluation, the patient reports that he has not had a repeat headache and is feeling much better. We reviewed medication changes and need for follow up as well as strict return precautions and red flag symptoms.  [RR]    Clinical Course User Index [RR] Spence Dux, PA-C   Medical Decision Making Amount and/or Complexity of Data Reviewed Labs: ordered. Radiology: ordered.  Risk OTC drugs. Prescription drug management.   43 y.o. male presents to the ER for evaluation of right sided headache. Differential diagnosis includes but is not limited to Stroke, increased ICP, meningitis, CVA, intracranial tumor, venous sinus thrombosis, migraine, cluster headache, hypertension, drug related, head injury, tension headache, sinusitis, dental abscess, otitis media, TMJ. Vital signs elevated BP otherwise unremarkable. Physical exam as noted above.   I independently reviewed and interpreted the patient's labs. CBC without leukocytosis or anemia. CMP shows glucose at 106.   CT Head was ordered in triage, however, given his symptoms, my attending and I have discussed the symptoms of present allergy and will proceed with CTA of the head and neck.  In the meantime, will order migraine cocktail with Decadron , fluids, Reglan , Tylenol , Benadryl .  CT head shows Negative noncontrast head CT. Per radiologist's interpretation.   CT head and neck angio shows 1. No  proximal intracranial large vessel occlusion identified. 2. 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 3. 1-2 mm vascular protrusion arising from the right internal carotid artery at the petrous-cavernous junction, which may reflect an aneurysm or the origin of an otherwise poorly delineated small branch vessel. Per radiologist's interpretation.    I have consulted neurology and spoke with Dr. Evalee Hila.  Please see ED workup.  He would like for the patient to stop taking the losartan and to start with her verapamil .  Recommends 40 mg 3 times daily for 15 days.  If  the patient has good headache control with this, can continue with the 40 mg.  If not, can increase to 80 mg 3 times daily.  Recommends follow-up with Dr. Narciso Backers with [facility] neurology.  Also recommends prescribing the sumatriptan .  Will proceed with oxygen as reports this is likely a cluster headache.  As per the imaging results, reports he can follow-up with Dr. Narciso Backers but no emergent pathology or intervention currently.  Does not think this is any vasculitis.  Patient was started on 15 L on nonrebreather for 30 minutes.  On reevaluation, patient reports that he is feeling much better and has not had a headache since.  He is ambulatory and has a benign neurological exam.  I have sent in new prescriptions.  We had a long discussion about medications, supportive care, and the need to follow-up.  We discussed his medication changes as well as possible need to follow-up with his PCP for refills if Dr. Thurston Flow cannot see him in enough time.  We talked about making sure to eat and drink regularly throughout the day.  Discussed about limiting stress.  Given his benign neurological examination as well as consultation from neurology and plan, he is stable for discharge home with close outpatient follow-up.  We discussed the results of the labs/imaging. The plan is medication changes, follow-up with neurology, supportive  care. We discussed strict return precautions and red flag symptoms. The patient verbalized their understanding and agrees to the plan. The patient is stable and being discharged home in good condition.  Portions of this report may have been transcribed using voice recognition software. Every effort was made to ensure accuracy; however, inadvertent computerized transcription errors may be present.    Final Clinical Impression(s) / ED Diagnoses Final diagnoses:  Episodic cluster headache, not intractable    Rx / DC Orders ED Discharge Orders          Ordered    verapamil  (CALAN ) 40 MG tablet  3 times daily        10/05/23 1552    verapamil  (CALAN ) 40 MG tablet  3 times daily        10/05/23 1552    methocarbamol  (ROBAXIN ) 500 MG tablet  2 times daily PRN        10/05/23 1552    SUMAtriptan  (IMITREX ) 50 MG tablet  Every 2 hours PRN        10/05/23 1552              Spence Dux, PA-C 10/05/23 1618    Wynetta Heckle, MD 10/24/23 1221

## 2024-02-19 ENCOUNTER — Ambulatory Visit: Admitting: Neurology
